# Patient Record
Sex: Female | Born: 1985 | Race: Black or African American | Hispanic: No | Marital: Single | Smoking: Former smoker
Health system: Southern US, Community
[De-identification: ages and names within clinical notes are randomized; demographics above are authoritative.]

## PROBLEM LIST (undated history)

## (undated) ENCOUNTER — Inpatient Hospital Stay (HOSPITAL_COMMUNITY): Payer: Self-pay

## (undated) ENCOUNTER — Emergency Department (HOSPITAL_COMMUNITY): Payer: BC Managed Care – PPO

## (undated) DIAGNOSIS — N751 Abscess of Bartholin's gland: Secondary | ICD-10-CM

## (undated) DIAGNOSIS — D573 Sickle-cell trait: Secondary | ICD-10-CM

## (undated) DIAGNOSIS — IMO0002 Reserved for concepts with insufficient information to code with codable children: Secondary | ICD-10-CM

## (undated) DIAGNOSIS — O99810 Abnormal glucose complicating pregnancy: Secondary | ICD-10-CM

## (undated) DIAGNOSIS — R87619 Unspecified abnormal cytological findings in specimens from cervix uteri: Secondary | ICD-10-CM

## (undated) DIAGNOSIS — J45909 Unspecified asthma, uncomplicated: Secondary | ICD-10-CM

## (undated) DIAGNOSIS — N750 Cyst of Bartholin's gland: Secondary | ICD-10-CM

## (undated) DIAGNOSIS — R8789 Other abnormal findings in specimens from female genital organs: Secondary | ICD-10-CM

## (undated) HISTORY — DX: Cyst of Bartholin's gland: N75.0

## (undated) HISTORY — PX: TOOTH EXTRACTION: SUR596

## (undated) HISTORY — DX: Unspecified asthma, uncomplicated: J45.909

## (undated) HISTORY — DX: Abscess of Bartholin's gland: N75.1

## (undated) HISTORY — DX: Sickle-cell trait: D57.3

## (undated) HISTORY — DX: Reserved for concepts with insufficient information to code with codable children: IMO0002

## (undated) HISTORY — DX: Abnormal glucose complicating pregnancy: O99.810

## (undated) HISTORY — DX: Other abnormal findings in specimens from female genital organs: R87.89

---

## 2006-02-22 ENCOUNTER — Emergency Department (HOSPITAL_COMMUNITY): Admission: EM | Admit: 2006-02-22 | Discharge: 2006-02-23 | Payer: Self-pay | Admitting: Emergency Medicine

## 2007-07-22 ENCOUNTER — Ambulatory Visit (HOSPITAL_COMMUNITY): Admission: RE | Admit: 2007-07-22 | Discharge: 2007-07-22 | Payer: Self-pay | Admitting: Family Medicine

## 2009-02-28 ENCOUNTER — Encounter: Payer: Self-pay | Admitting: Sports Medicine

## 2009-03-01 ENCOUNTER — Ambulatory Visit: Payer: Self-pay | Admitting: Physician Assistant

## 2009-03-01 ENCOUNTER — Inpatient Hospital Stay (HOSPITAL_COMMUNITY): Admission: AD | Admit: 2009-03-01 | Discharge: 2009-03-01 | Payer: Self-pay | Admitting: Obstetrics & Gynecology

## 2009-03-22 ENCOUNTER — Ambulatory Visit: Payer: Self-pay | Admitting: Family Medicine

## 2009-03-22 ENCOUNTER — Encounter: Payer: Self-pay | Admitting: Sports Medicine

## 2009-03-23 ENCOUNTER — Encounter: Payer: Self-pay | Admitting: Sports Medicine

## 2009-03-23 LAB — CONVERTED CEMR LAB
Antibody Screen: NEGATIVE
Basophils Absolute: 0 10*3/uL (ref 0.0–0.1)
Basophils Relative: 0 % (ref 0–1)
Eosinophils Absolute: 0.1 K/uL (ref 0.0–0.7)
Eosinophils Relative: 1 % (ref 0–5)
HCT: 32.9 % — ABNORMAL LOW (ref 36.0–46.0)
Hemoglobin: 11.5 g/dL — ABNORMAL LOW (ref 12.0–15.0)
Hepatitis B Surface Ag: NEGATIVE
Lymphocytes Relative: 31 % (ref 12–46)
Lymphs Abs: 2.1 K/uL (ref 0.7–4.0)
MCHC: 35 g/dL (ref 30.0–36.0)
MCV: 83.3 fL (ref 78.0–100.0)
Monocytes Absolute: 0.5 10*3/uL (ref 0.1–1.0)
Monocytes Relative: 7 % (ref 3–12)
Neutro Abs: 4.2 10*3/uL (ref 1.7–7.7)
Neutrophils Relative %: 61 % (ref 43–77)
Platelets: 264 10*3/uL (ref 150–400)
RBC: 3.95 M/uL (ref 3.87–5.11)
RDW: 12.7 % (ref 11.5–15.5)
Rh Type: POSITIVE
Rubella: 172.6 [IU]/mL — ABNORMAL HIGH
Sickle Cell Screen: POSITIVE — AB
WBC: 6.9 10*3/microliter (ref 4.0–10.5)

## 2009-03-27 ENCOUNTER — Telehealth: Payer: Self-pay | Admitting: Family Medicine

## 2009-03-27 ENCOUNTER — Ambulatory Visit: Payer: Self-pay | Admitting: Family Medicine

## 2009-03-27 ENCOUNTER — Encounter: Payer: Self-pay | Admitting: Sports Medicine

## 2009-03-31 DIAGNOSIS — D573 Sickle-cell trait: Secondary | ICD-10-CM

## 2009-03-31 LAB — CONVERTED CEMR LAB
Hgb A2 Quant: 3 %
Hgb A: 51.8 % — ABNORMAL LOW
Hgb F Quant: 4.1 % — ABNORMAL HIGH
Hgb S Quant: 41.1 % — ABNORMAL HIGH

## 2009-04-03 ENCOUNTER — Other Ambulatory Visit: Admission: RE | Admit: 2009-04-03 | Discharge: 2009-04-03 | Payer: Self-pay | Admitting: Family Medicine

## 2009-04-03 ENCOUNTER — Ambulatory Visit: Payer: Self-pay | Admitting: Family Medicine

## 2009-04-03 ENCOUNTER — Encounter: Payer: Self-pay | Admitting: *Deleted

## 2009-04-03 ENCOUNTER — Encounter: Payer: Self-pay | Admitting: Sports Medicine

## 2009-04-03 LAB — CONVERTED CEMR LAB
Chlamydia, DNA Probe: NEGATIVE
GC Probe Amp, Genital: NEGATIVE
Whiff Test: NEGATIVE

## 2009-04-04 ENCOUNTER — Ambulatory Visit (HOSPITAL_COMMUNITY): Admission: RE | Admit: 2009-04-04 | Discharge: 2009-04-04 | Payer: Self-pay | Admitting: Family Medicine

## 2009-04-04 ENCOUNTER — Encounter: Payer: Self-pay | Admitting: Sports Medicine

## 2009-04-06 ENCOUNTER — Ambulatory Visit: Payer: Self-pay | Admitting: Family Medicine

## 2009-04-06 ENCOUNTER — Encounter: Payer: Self-pay | Admitting: Sports Medicine

## 2009-04-06 LAB — CONVERTED CEMR LAB
Toxoplasma Antibody- IgM: <0.1
Toxoplasma IgG Ratio: <0.5

## 2009-05-03 ENCOUNTER — Ambulatory Visit: Payer: Self-pay | Admitting: Family Medicine

## 2009-06-01 ENCOUNTER — Encounter: Payer: Self-pay | Admitting: Sports Medicine

## 2009-06-01 ENCOUNTER — Ambulatory Visit: Payer: Self-pay | Admitting: Family Medicine

## 2009-06-02 ENCOUNTER — Encounter: Payer: Self-pay | Admitting: *Deleted

## 2009-06-05 ENCOUNTER — Ambulatory Visit (HOSPITAL_COMMUNITY): Admission: RE | Admit: 2009-06-05 | Discharge: 2009-06-05 | Payer: Self-pay | Admitting: Sports Medicine

## 2009-06-05 ENCOUNTER — Encounter: Payer: Self-pay | Admitting: Sports Medicine

## 2009-06-28 ENCOUNTER — Ambulatory Visit: Payer: Self-pay | Admitting: Family Medicine

## 2009-07-16 ENCOUNTER — Inpatient Hospital Stay (HOSPITAL_COMMUNITY): Admission: AD | Admit: 2009-07-16 | Discharge: 2009-07-16 | Payer: Self-pay | Admitting: Obstetrics & Gynecology

## 2009-07-25 ENCOUNTER — Ambulatory Visit: Payer: Self-pay | Admitting: Family Medicine

## 2009-07-25 ENCOUNTER — Encounter: Payer: Self-pay | Admitting: Sports Medicine

## 2009-07-31 ENCOUNTER — Encounter: Payer: Self-pay | Admitting: Sports Medicine

## 2009-07-31 ENCOUNTER — Ambulatory Visit: Payer: Self-pay | Admitting: Family Medicine

## 2009-07-31 DIAGNOSIS — O99810 Abnormal glucose complicating pregnancy: Secondary | ICD-10-CM

## 2009-07-31 HISTORY — DX: Abnormal glucose complicating pregnancy: O99.810

## 2009-08-09 ENCOUNTER — Encounter: Payer: Self-pay | Admitting: Sports Medicine

## 2009-08-09 ENCOUNTER — Ambulatory Visit: Payer: Self-pay | Admitting: Family Medicine

## 2009-08-09 LAB — CONVERTED CEMR LAB
HCT: 27.1 % — ABNORMAL LOW (ref 36.0–46.0)
Hemoglobin: 8.9 g/dL — ABNORMAL LOW (ref 12.0–15.0)
MCHC: 32.8 g/dL (ref 30.0–36.0)
MCV: 88.6 fL (ref 78.0–100.0)
Platelets: 216 10*3/uL (ref 150–400)
RBC: 3.06 M/uL — ABNORMAL LOW (ref 3.87–5.11)
RDW: 12.6 % (ref 11.5–15.5)
WBC: 7.3 10*3/uL (ref 4.0–10.5)

## 2009-08-22 ENCOUNTER — Telehealth (INDEPENDENT_AMBULATORY_CARE_PROVIDER_SITE_OTHER): Payer: Self-pay | Admitting: *Deleted

## 2009-08-30 ENCOUNTER — Ambulatory Visit: Payer: Self-pay | Admitting: Family Medicine

## 2009-08-30 DIAGNOSIS — O99019 Anemia complicating pregnancy, unspecified trimester: Secondary | ICD-10-CM

## 2009-09-12 ENCOUNTER — Telehealth: Payer: Self-pay | Admitting: Family Medicine

## 2009-09-18 ENCOUNTER — Ambulatory Visit: Payer: Self-pay | Admitting: Family Medicine

## 2009-09-18 ENCOUNTER — Encounter: Payer: Self-pay | Admitting: Sports Medicine

## 2009-09-18 LAB — CONVERTED CEMR LAB: Hemoglobin: 8.9 g/dL

## 2009-09-24 ENCOUNTER — Ambulatory Visit: Payer: Self-pay | Admitting: Obstetrics and Gynecology

## 2009-09-24 ENCOUNTER — Inpatient Hospital Stay (HOSPITAL_COMMUNITY): Admission: AD | Admit: 2009-09-24 | Discharge: 2009-09-24 | Payer: Self-pay | Admitting: Obstetrics and Gynecology

## 2009-09-25 ENCOUNTER — Telehealth (INDEPENDENT_AMBULATORY_CARE_PROVIDER_SITE_OTHER): Payer: Self-pay | Admitting: *Deleted

## 2009-10-02 ENCOUNTER — Encounter: Payer: Self-pay | Admitting: Sports Medicine

## 2009-10-02 ENCOUNTER — Ambulatory Visit: Payer: Self-pay | Admitting: Family Medicine

## 2009-10-02 LAB — CONVERTED CEMR LAB
Chlamydia, DNA Probe: NEGATIVE
GC Probe Amp, Genital: NEGATIVE
Hemoglobin: 9.4 g/dL

## 2009-10-10 ENCOUNTER — Ambulatory Visit: Payer: Self-pay | Admitting: Family Medicine

## 2009-10-10 LAB — CONVERTED CEMR LAB: Hemoglobin: 9.6 g/dL

## 2009-10-18 ENCOUNTER — Encounter: Payer: Self-pay | Admitting: Sports Medicine

## 2009-10-18 ENCOUNTER — Ambulatory Visit: Payer: Self-pay | Admitting: Family Medicine

## 2009-10-18 LAB — CONVERTED CEMR LAB
Bilirubin Urine: NEGATIVE
Blood in Urine, dipstick: NEGATIVE
Glucose, Urine, Semiquant: NEGATIVE
Hemoglobin: 10.5 g/dL
Ketones, urine, test strip: NEGATIVE
Nitrite: NEGATIVE
Protein, U semiquant: NEGATIVE
Specific Gravity, Urine: 1.02
Urobilinogen, UA: 0.2
WBC Urine, dipstick: NEGATIVE
pH: 7

## 2009-10-25 ENCOUNTER — Ambulatory Visit: Payer: Self-pay | Admitting: Family Medicine

## 2009-10-25 ENCOUNTER — Telehealth: Payer: Self-pay | Admitting: Family Medicine

## 2009-10-25 LAB — CONVERTED CEMR LAB: Hemoglobin: 8.8 g/dL

## 2009-10-26 ENCOUNTER — Encounter: Payer: Self-pay | Admitting: Sports Medicine

## 2009-10-26 ENCOUNTER — Inpatient Hospital Stay (HOSPITAL_COMMUNITY): Admission: AD | Admit: 2009-10-26 | Discharge: 2009-10-26 | Payer: Self-pay | Admitting: Obstetrics & Gynecology

## 2009-10-26 ENCOUNTER — Ambulatory Visit: Payer: Self-pay | Admitting: Advanced Practice Midwife

## 2009-10-27 ENCOUNTER — Encounter: Payer: Self-pay | Admitting: Sports Medicine

## 2009-10-27 ENCOUNTER — Ambulatory Visit: Payer: Self-pay | Admitting: Obstetrics & Gynecology

## 2009-10-28 ENCOUNTER — Inpatient Hospital Stay (HOSPITAL_COMMUNITY): Admission: AD | Admit: 2009-10-28 | Discharge: 2009-10-28 | Payer: Self-pay | Admitting: Obstetrics & Gynecology

## 2009-10-29 ENCOUNTER — Ambulatory Visit: Payer: Self-pay | Admitting: Advanced Practice Midwife

## 2009-10-29 ENCOUNTER — Inpatient Hospital Stay (HOSPITAL_COMMUNITY): Admission: AD | Admit: 2009-10-29 | Discharge: 2009-10-29 | Payer: Self-pay | Admitting: Family Medicine

## 2009-10-30 ENCOUNTER — Encounter: Payer: Self-pay | Admitting: Sports Medicine

## 2009-10-30 ENCOUNTER — Encounter: Payer: Self-pay | Admitting: Family Medicine

## 2009-10-30 ENCOUNTER — Ambulatory Visit: Payer: Self-pay | Admitting: Family Medicine

## 2009-10-30 ENCOUNTER — Inpatient Hospital Stay (HOSPITAL_COMMUNITY): Admission: AD | Admit: 2009-10-30 | Discharge: 2009-11-02 | Payer: Self-pay | Admitting: Obstetrics & Gynecology

## 2009-10-30 LAB — CONVERTED CEMR LAB: Hemoglobin: 11.2 g/dL

## 2009-11-06 ENCOUNTER — Ambulatory Visit: Payer: Self-pay | Admitting: Family Medicine

## 2009-11-23 ENCOUNTER — Encounter: Payer: Self-pay | Admitting: Sports Medicine

## 2009-12-18 ENCOUNTER — Ambulatory Visit: Payer: Self-pay | Admitting: Family Medicine

## 2009-12-18 DIAGNOSIS — N912 Amenorrhea, unspecified: Secondary | ICD-10-CM

## 2009-12-18 LAB — CONVERTED CEMR LAB: Beta hcg, urine, semiquantitative: NEGATIVE

## 2010-05-27 ENCOUNTER — Emergency Department (HOSPITAL_COMMUNITY): Admission: EM | Admit: 2010-05-27 | Discharge: 2010-05-28 | Payer: Self-pay | Admitting: Emergency Medicine

## 2010-06-07 ENCOUNTER — Ambulatory Visit: Payer: Self-pay | Admitting: Family Medicine

## 2010-06-07 DIAGNOSIS — N751 Abscess of Bartholin's gland: Secondary | ICD-10-CM

## 2010-06-07 HISTORY — DX: Abscess of Bartholin's gland: N75.1

## 2010-06-07 LAB — CONVERTED CEMR LAB: Beta hcg, urine, semiquantitative: NEGATIVE

## 2011-01-22 NOTE — Assessment & Plan Note (Signed)
 Summary: OB F/U [facility], [place]   Vital Signs:  Patient profile:   25 year old female Weight:      107 pounds Temp:     98.7 degrees F oral Pulse rate:   82 / minute BP sitting:   112 / 75  (left arm)  Vitals Entered By: Letitia Reusing (May 03, 2009 1:40 PM) CC: OB Is Patient Diabetic? No   History of Present Illness: 25 year old G3P0020 at 15.1 weeks, here for routine OB visit.  See prenatal flowsheet for further details.  Habits & Providers     Tobacco Status: never     Cigarette Packs/Day: n/a  Social History:    Smoking Status:  never    Packs/Day:  n/a   Impression & Recommendations:  Problem # 1:  SUPERVISION OF NORMAL FIRST PREGNANCY (ICD-V22.0) 15.1 weeks by LMP No issues found. Will check anatomy ultrasound at next visit.   Hx exposure to cat litter in beginning of pregnancy, Toxoplasma titers negative. Difficulty with tabs and chewable prenatals, will switch to prenatal caplets today.   Educated on S&S prompting MAU visit. Mother with sickle cell trait, awaiting FOB results.  Can complete genetic counselling when his results are in. RTC 4 weeks.  Orders: Medicaid OB visit - [facility] (00786)  Complete Medication List: 1)  Vita-natal Caps (Prenatal multivit-min-fe-fa) .... One cap by mouth daily  Patient Instructions: 1)  Great to see you today, 2)  Everything seems to be going normally.  Come back to see me in 4 weeks and we can set up your anatomy ultrasound to determine the sex.  Clotilda please forward me the Sickle Cell results when they are available. 3)  If you have any bleeding, fluid leakage, contractions then go to womens hospital for evaluation. 4)  I sent some prenatal caplets to the pharmacy, hopefully they wil be easier to swallow. 5)  Come back in 4 weeks. 6)  -Dr. ONEIDA. Prescriptions: VITA-NATAL  CAPS (PRENATAL MULTIVIT-MIN-FE-FA) One cap by mouth daily  #90 x 3   Entered and Authorized by:   Debby Petties MD   Signed by:   Debby Petties MD on  05/03/2009   Method used:   Electronically to        [id number] (retail)       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 test: 02/28/2009   Flowsheet View  for Follow-up Visit    Estimated weeks of       gestation:     15 1/7    Weight:     107    Blood pressure:   112 / 75    Headache:     No    Nausea/vomiting:   No    Edema:     0    Vaginal bleeding:   no    Vaginal discharge:   no    Fundal height:      15    FHR:       1448    Fetal activity:     no    Labor symptoms:   no    Fetal position:     N/A    Taking prenatal vits?   Y    Smoking:     n/a    Next visit:     4 wk    Resident:     Curtis    Preceptor:     Rowand    Comment:     Doing wel, having trouble with chewable vitamins, will switch to caplets.  No other complaints.   Flowsheet View for Follow-up Visit    Estimated weeks of       gestation:     15 1/7    Weight:     107    Blood pressure:   112 / 75    Hx headache?     No    Nausea/vomiting?   No    Edema?     0    Bleeding?     no    Leakage/discharge?   no    Fetal activity:       no    Labor symptoms?   no    Fundal height:      15    FHR:       1448    Fetal  position:      N/A    Taking Vitamins?   Y    Smoking PPD:   n/a    Comment:     Doing wel, having trouble with chewable vitamins, will switch to caplets.  No other complaints.    Next visit:     4 wk    Resident:     Curtis    Preceptor:     Xavier

## 2011-01-22 NOTE — Assessment & Plan Note (Signed)
 Summary: ob visit/eo   Vital Signs:  Patient profile:   25 year old female Weight:      118.7 pounds Temp:     98.1 degrees F oral Pulse rate:   78 / minute BP sitting:   95 / 64  (left arm)  Vitals Entered By: Letitia Reusing (June 28, 2009 2:13 PM) CC: ob Is Patient Diabetic? No   Primary Care Provider:  Debby Petties MD  CC:  ob.  History of Present Illness: 24G1P0 at 23.1 weeks here for routine OB visit.  See flowsheet for further details.  Habits & Providers  Alcohol-Tobacco-Diet     Tobacco Status: never     Cigarette Packs/Day: n/a   Impression & Recommendations:  Problem # 1:  SUPERVISION OF NORMAL FIRST PREGNANCY (ICD-V22.0) Assessment Unchanged 23.1 weeks by LMP No issues found. Adequate weight gain. Normal Anatomy US  - Female baby Hx exposure to cat litter in beginning of pregnancy, Toxoplasma titers negative. Tolerating prenatal caplets. Educated on S&S prompting MAU visit. Mother with sickle cell trait, still awaiting FOB results however he did recently have his blood drawn for the test.  Can complete genetic counselling when his results are in. RTC 4 weeks.  Orders: Medicaid OB visit - [facility] ([id number])  Complete Medication List: 1)  Prefera Ob + Dha 22-6-1 & 200 Mg Misc (Prenat fepoly-fehempo-fa-dha) .... One tab by mouth daily 2)  Acetaminophen  650 Mg Cr-tabs (Acetaminophen ) .... One tab by mouth q8h as needed for pain.  Patient Instructions: 1)  Great to see you today, 2)  Everything seems to be going normally.   3)  Clotilda please forward me the Sickle Cell results when they are available. 4)  If you have any bleeding, fluid leakage  then go to womens hospital for evaluation. 5)  Cont to take your prenatal vitamins. 6)  Come back in 4 weeks. 7)  I will send the Acetaminophen  to the pharmacy for your back pain. 8)  -Dr. ONEIDA. Prescriptions: ACETAMINOPHEN  650 MG CR-TABS (ACETAMINOPHEN ) One tab by mouth q8h as needed for pain.  #40 x 3   Entered and  Authorized by:   Debby Petties MD   Signed by:   Debby Petties MD on 06/28/2009   Method used:   Electronically to        [id number] (retail)       [address] [place]  [address]       [place], [place]  [address]       Ph: [phone] or [phone]       Fax: [phone]   RxID:   [id number]    Flowsheet View for Follow-up Visit    Estimated weeks of       gestation:     23 1/7    Weight:     118.7    Blood pressure:   95 / 64    Hx headache?     No    Nausea/vomiting?   No    Edema?     0    Bleeding?     no    Leakage/discharge?   no    Fetal activity:       yes    Labor symptoms?   no    Fundal height:      22    FHR:       145    Fetal position:  transv    Taking Vitamins?   Y    Smoking PPD:   n/a    Comment:     Doing well, tolerating PNV, normal anatomy US , feels baby kick/move, some LBP, will give Acetaminophen .  No other issues.  RTC 4 weeks.    Next visit:     4 wk    Resident:     Curtis    Preceptor:     Hudnall   Flowsheet View for Follow-up Visit    Estimated weeks of       gestation:     23 1/7    Weight:     118.7    Blood pressure:   95 / 64    Headache:     No    Nausea/vomiting:   No    Edema:     0    Vaginal bleeding:   no    Vaginal discharge:   no    Fundal height:      22    FHR:       145    Fetal activity:     yes    Labor symptoms:   no    Fetal position:     transv    Taking prenatal vits?   Y    Smoking:     n/a    Next visit:     4 wk    Resident:     Curtis    Preceptor:     Hudnall    Comment:     Doing well, tolerating PNV, normal anatomy US , feels baby kick/move, some LBP, will give Acetaminophen .  No other issues.  RTC 4 weeks.  Appended Document: ob visit/eo Correction, pt is a G3P0020 not G1 as in HPI.

## 2011-01-22 NOTE — Assessment & Plan Note (Signed)
 Summary: [facility]   Vital Signs:  Patient profile:   25 year old female Weight:      134.3 pounds Temp:     97.9 degrees F Pulse rate:   69 / minute BP sitting:   114 / 73  (left arm)  Vitals Entered By: Avelina Sharps RN (October 10, 2009 8:31 AM) CC: OB follow up Is Patient Diabetic? No   Primary Care Provider:  Debby Petties MD  CC:  OB follow up.  History of Present Illness: 25yo G3P0020 at 38.0wk for routine prenatal visit. See flowsheet and assessment for complete details.  Habits & Providers  Alcohol-Tobacco-Diet     Tobacco Status: quit     Cigarette Packs/Day: n/a  Social History: Smoking Status:  quit  Review of Systems       See HPI  Physical Exam  Abdomen:  Soft, gravid, consistent with dates.   Impression & Recommendations:  Problem # 1:  SUPERVISION OF NORMAL FIRST PREGNANCY (ICD-V22.0) Assessment Unchanged  G3P0020 38.0 weeks by LMP Adequate weight gain, 27 lbs so far. Normal Anatomy US  - Female baby Hb:11.5 -> 8.9 -> 8.9 -> Plt:264 RPR: NR x2 Blood: A pos Ab: neg HepB: neg HIV: neg x2 Rubella: Immune GC/Chlam: neg/neg Sickle Cell trait Hx exposure to cat litter in beginning of pregnancy, Toxoplasma titers negative. Tolerating prenatal caplets. Educated on S&S prompting MAU visit. 1hGTT 150, 3h- normal GBS neg Desires to BF.  Desires micronor .  RTC 1 week.  @ 40 weeks, schedule biweekly NST, induce at 41 weeks.  Orders: Hemoglobin-[facility] (14981)  Orders: Medicaid OB visit - [facility] ([id number])  Problem # 2:  ANEMIA (ICD-285.9) Assessment: Improved Tolerating Fe Gluconate, check Hb today.  Her updated medication list for this problem includes:    Ferrous Gluconate 325 Mg Tabs (Ferrous gluconate) ..... 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  Baby has 25% chance of being a trait carrier, 0% chance of having disease.  Orders: Medicaid OB  visit - [facility] ([id number])  Complete Medication List: 1)  Prefera Ob + Dha 22-6-1 & 200 Mg Misc (Prenat fepoly-fehempo-fa-dha) .... One tab by mouth daily 2)  Acetaminophen  650 Mg Cr-tabs (Acetaminophen ) .... One tab by mouth q8h as needed for pain. 3)  Ferrous Gluconate 325 Mg Tabs (Ferrous gluconate) .... One tab by mouth bid  Patient Instructions: 1)  Please schedule a follow-up appointment in 1 week. 2)  Go to [facility] if you have vaginal bleeding, rupture of membranes, painful contractions that are regular and occur every 4-5 minutes, or if you have any trauma to your abdomen. 3)  Do daily kick counts. 4)  Continue to take the iron pill 2x a day. 5)  -Dr. IVAR [place] for Follow-up Visit    Estimated weeks of       gestation:     38 0/7    Weight:     134.3    Blood pressure:   114 / 73    Hx headache?     No    Nausea/vomiting?   No    Edema?     0    Bleeding?     no    Leakage/discharge?   no    Fetal activity:       yes    Labor symptoms?   few ctx    Fundal height:  38    FHR:       135    Fetal position:      vertex    Cx dilation:     0    Cx effacement:   0    Fetal station:     high    Taking Vitamins?   Y    Smoking PPD:   n/a    Comment:     Doing well, tolerating PNV, tol iron, no other issues.  RTC 1 week.    Next visit:     1 wk    Resident:     Curtis    Preceptor:     Chambliss/Breen   Laboratory Results   Blood Tests   Date/Time Received: October 10, 2009 9:00 AM  Date/Time Reported: October 10, 2009 9:14 AM     CBC   HGB:  9.6 g/dL   (Normal Range: 86.9-82.9 in Males, 12.0-15.0 in Females) Comments: capillary sample ...............test performed by......SABRABonnie A. Jordan, [place] (ASCP)

## 2011-01-22 NOTE — Assessment & Plan Note (Signed)
 Summary: post partum/eo   Vital Signs:  Patient profile:   25 year old female Weight:      115.5 pounds Temp:     97.8 degrees F oral Pulse rate:   69 / minute Pulse rhythm:   regular BP sitting:   114 / 77  (right arm) Cuff size:   regular  Vitals Entered By: Bascom Pica CMA (December 18, 2009 10:51 AM) CC: post partum check, C-V Risk Management   Primary Care Provider:  Debby Petties MD  CC:  post partum check and C-V Risk Management.  History of Present Illness: 6wk PP, had PLTCS for failure to progress, doing well, mood good, breast+bottle, adapting well to new sleep schedule with baby.  No complaints.  Missed some doses of contraceptive so wants pregnancy test.  Cardiovascular Risk History:      Negative major cardiovascular risk factors include female age less than 67 years old and non-tobacco-user status.    Current Medications (verified): 1)  Prefera Ob + Dha 22-6-1 & 200 Mg Misc (Prenat Fepoly-Fehempo-Fa-Dha) .... One Tab By Mouth Daily 2)  Acetaminophen  650 Mg Cr-Tabs (Acetaminophen ) .... One Tab By Mouth Q8h As Needed For Pain. 3)  Ferrous Gluconate 325 Mg Tabs (Ferrous Gluconate) .... One Tab By Mouth Bid  Allergies (verified): No Known Drug Allergies  Review of Systems       See HPI  Physical Exam  General:  Well-developed,well-nourished,in no acute distress; alert,appropriate and cooperative throughout examination Abdomen:  Bowel sounds positive,abdomen soft and non-tender without masses, organomegaly or hernias noted. C/S scar well healed, no signs of hernias.   Impression & Recommendations:  Problem # 1:  POSTPARTUM EXAMINATION, NORMAL (ICD-V24.2) Assessment New Doing well, no complaints, come back to see me as needed.  Orders: Postpartum visit- [facility] (40569)  Problem # 2:  AMENORRHEA (ICD-626.0) Assessment: New Upreg neg.  Orders: U Preg-[facility] (81025)  Complete Medication List: 1)  Prefera Ob + Dha 22-6-1 & 200 Mg Misc (Prenat  fepoly-fehempo-fa-dha) .... One tab by mouth daily 2)  Acetaminophen  650 Mg Cr-tabs (Acetaminophen ) .... One tab by mouth q8h as needed for pain. 3)  Ferrous Gluconate 325 Mg Tabs (Ferrous gluconate) .... One tab by mouth bid  Cardiovascular Risk Assessment/Plan:      The patient's hypertensive risk group is category A: No risk factors and no target organ damage.  Today's blood pressure is 114/77.     Laboratory Results   Urine Tests  Date/Time Received: December 18, 2009 11:11 AM  Date/Time Reported: December 18, 2009 11:21 AM     Urine HCG: negative Comments: ...........test performed by...........SABRAArland Morel, CMA

## 2011-01-22 NOTE — Assessment & Plan Note (Signed)
 Summary: OB VISIT/EO   Vital Signs:  Patient profile:   25 year old female Weight:      137 pounds BP sitting:   104 / 72  Vitals Entered By: Nathanel Saba RN (October 18, 2009 8:58 AM)  Primary Care Provider:  Debby Petties MD   History of Present Illness: 25yo G3P0020 at 39.0wk for routine prenatal visit.  Some CTX that started last night about 2am, irregular. See flowsheet and assessment for complete details.  Habits & Providers  Alcohol-Tobacco-Diet     Cigarette Packs/Day: n/a  Review of Systems       See HPI  Physical Exam  General:  Well-developed,well-nourished,in no acute distress; alert,appropriate and cooperative throughout examination Abdomen:  Soft, gravid, consistent with dates.   Impression & Recommendations:  Problem # 1:  SUPERVISION OF NORMAL FIRST PREGNANCY (ICD-V22.0) Assessment Unchanged G3P0020 39.0 weeks by LMP Adequate weight gain, 27 lbs so far. Normal Anatomy US  - Female baby Hb:11.5 -> 8.9 -> 8.9 ->10.5 Plt:264 RPR: NR x2 Blood: A pos Ab: neg HepB: neg HIV: neg x2 Rubella: Immune GC/Chlam: neg/neg Sickle Cell trait Hx exposure to cat litter in beginning of pregnancy, Toxoplasma titers negative. Tolerating prenatal caplets. Educated on S&S prompting MAU visit. 1hGTT 150, 3h- normal GBS neg Desires to BF.  Desires micronor . RTC 1 week. @ 40 weeks, schedule biweekly NST, induce at 41 weeks. Few irregular CTX this visit, no cervical change, no signs ROM, afebrile, UA neg, likely false labor.  Orders: Urinalysis-[facility] (00000) Medicaid OB visit - [facility] ([id number])  Problem # 2:  ANEMIA (ICD-285.9) Assessment: Improved Hb Improved today, 10.5.  Her updated medication list for this problem includes:    Ferrous Gluconate 325 Mg Tabs (Ferrous gluconate) ..... 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  Baby has 25% chance of being a trait carrier, 0% chance of having disease.  Complete Medication List: 1)  Prefera Ob + Dha 22-6-1 & 200 Mg Misc (Prenat fepoly-fehempo-fa-dha) .... One tab by mouth daily 2)  Acetaminophen  650 Mg Cr-tabs (Acetaminophen ) .... One tab by mouth q8h as needed for pain. 3)  Ferrous Gluconate 325 Mg Tabs (Ferrous gluconate) .... One tab by mouth bid  Patient Instructions: 1)  Please schedule a follow-up appointment in 1 week. 2)  Go to [facility] if you have vaginal bleeding, rupture of membranes, painful contractions that are regular and occur every 4-5 minutes, or if you have any trauma to your abdomen. 3)  Do daily kick counts. 4)  Continue to take the iron pill 2x a day. 5)  I will send you to our lab for urinalysis and hemoglobin. 6)  Come back to see me in one week. 7)  -Dr. 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 test: 02/28/2009   Flowsheet View for Follow-up Visit    Estimated weeks of       gestation:     39 1/7    Weight:     137    Blood pressure:   104 / 72    Urine Protein:     negative    Urine Glucose:   negative    Urine Nitrite:     negative    Headache:     No    Nausea/vomiting:   No    Edema:     0    Vaginal bleeding:   no    Vaginal discharge:   no    Fundal height:      39    FHR:       130    Fetal activity:      yes    Labor symptoms:   few ctx    Fetal position:     vertex    Cx Dilation:     0    Cx Effacement:   0    Cx Station:     high    Taking prenatal vits?   Y    Smoking:     n/a    Next visit:     1 wk    Resident:     Curtis    Preceptor:     McDiarmid    Comment:     Doing well, tolerating PNV, tol iron, no other issues. More CTX today, irregular. No fluid leakage, bleeding. UA neg.  RTC 1 week.   Laboratory Results   Urine Tests  Date/Time Received: October 18, 2009 9:43 AM  Date/Time Reported: October 18, 2009 10:11 AM   Routine Urinalysis   Color: yellow Appearance: Clear Glucose: negative   (Normal Range: Negative) Bilirubin: negative   (Normal Range: Negative) Ketone: negative   (Normal Range: Negative) Spec. Gravity: 1.020   (Normal Range: 1.003-1.035) Blood: negative   (Normal Range: Negative) pH: 7.0   (Normal Range: 5.0-8.0) Protein: negative   (Normal Range: Negative) Urobilinogen: 0.2   (Normal Range: 0-1) Nitrite: negative   (Normal Range: Negative) Leukocyte Esterace: negative   (Normal Range: Negative)    Comments: ...........test performed by...........SABRAArland Morel, CMA   Blood Tests   Date/Time Received: October 18, 2009 9:43 AM  Date/Time Reported: October 18, 2009 10:11 AM     CBC   HGB:  10.5 g/dL   (Normal Range: 86.9-82.9 in Males, 12.0-15.0 in Females) Comments: ...........test performed by...........SABRAArland Morel, CMA      Flowsheet View for Follow-up Visit    Estimated weeks of       gestation:     39 1/7    Weight:     137    Blood pressure:   104 / 72    Urine protein:       negative    Urine glucose:    negative    Urine nitrite:     negative    Hx headache?     No    Nausea/vomiting?   No    Edema?     0    Bleeding?     no    Leakage/discharge?   no    Fetal activity:  yes    Labor symptoms?   few ctx    Fundal height:      39    FHR:       130    Fetal position:      vertex    Cx dilation:      0    Cx effacement:   0    Fetal station:     high    Taking Vitamins?   Y    Smoking PPD:   n/a    Comment:     Doing well, tolerating PNV, tol iron, no other issues. More CTX today, irregular. No fluid leakage, bleeding. UA neg.  RTC 1 week.    Next visit:     1 wk    Resident:     Curtis    Preceptor:     McDiarmid

## 2011-01-22 NOTE — Assessment & Plan Note (Signed)
 Summary: ob   Primary Care Provider:  Debby Petties MD   History of Present Illness: 25 year old G3P0020 at19.2 weeks, here for routine OB visit.  See prenatal flowsheet for further details.   Flowsheet View for Follow-up Visit    Estimated weeks of       gestation:     19 2/7    Weight:     111    Blood pressure:   114 / 68    Hx headache?     No    Nausea/vomiting?   No    Edema?     0    Bleeding?     no    Leakage/discharge?   no    Fetal activity:       yes    Labor symptoms?   no    Fundal height:      19    FHR:       140    Fetal position:      N/A    Taking Vitamins?   Y    Smoking PPD:   n/a    Comment:     Doing well, tolerating PNV, anatomy US  today.    Next visit:     4 wk    Resident:     Petties    Preceptor:     Chambliss  Physical Examination  Vital Signs:  BP (upright): 114/68  Wt: 111    Impression & Recommendations:  Problem # 1:  SUPERVISION OF NORMAL FIRST PREGNANCY (ICD-V22.0) Assessment Unchanged 19.2 weeks by LMP No issues found. Will check anatomy ultrasound now.   Hx exposure to cat litter in beginning of pregnancy, Toxoplasma titers negative. Tolerating prenatal caplets. Educated on S&S prompting MAU visit. Mother with sickle cell trait, awaiting FOB results.  Can complete genetic counselling when his results are in. RTC 4 weeks.  Orders: Medicaid OB visit - [facility] (00786) Prenatal U/S > 14 weeks - 23194 (Prenatal U/S)  Complete Medication List: 1)  Prefera Ob + Dha 22-6-1 & 200 Mg Misc (Prenat fepoly-fehempo-fa-dha) .... One tab by mouth daily  Patient Instructions: 1)  Great to see you today, 2)  Everything seems to be going normally.   3)  We will now set up your anatomy ultrasound to determine the sexof the baby.   4)  Clotilda please forward me the Sickle Cell results when they are available. 5)  If you have any bleeding, fluid leakage, contractions then go to womens hospital for evaluation. 6)  Cont to take your  prenatal vitamins. 7)  Come back in 4 weeks. 8)  -Dr. IVAR [place] for Follow-up Visit    Estimated weeks of       gestation:     19 2/7    Weight:     111    Blood pressure:   114 / 68    Headache:     No    Nausea/vomiting:   No    Edema:     0    Vaginal bleeding:   no    Vaginal discharge:   no    Fundal height:      19    FHR:       140    Fetal activity:     yes    Labor symptoms:   no    Fetal position:     N/A    Taking prenatal vits?   Y    Smoking:  n/a    Next visit:     4 wk    Resident:     Curtis    Preceptor:     Chambliss    Comment:     Doing well, tolerating PNV, anatomy US  today.   Flowsheet View for Follow-up Visit    Estimated weeks of       gestation:     19 2/7    Weight:     111    Blood pressure:   114 / 68    Hx headache?     No    Nausea/vomiting?   No    Edema?     0    Bleeding?     no    Leakage/discharge?   no    Fetal activity:       yes    Labor symptoms?   no    Fundal height:      19    FHR:       140    Fetal position:      N/A    Taking Vitamins?   Y    Smoking PPD:   n/a    Comment:     Doing well, tolerating PNV, anatomy US  today.    Next visit:     4 wk    Resident:     Curtis    Preceptor:     Jeanelle

## 2011-01-22 NOTE — Miscellaneous (Signed)
 Summary: Hb @ [facility]  Clinical Lists Changes  Observations: Added new observation of HGB: 11.2 g/dL (88/91/7989 3:73)

## 2011-01-22 NOTE — Assessment & Plan Note (Signed)
Summary: discuss birth control,tcb   Vital Signs:  Patient profile:   25 year old female Weight:      121.7 pounds Temp:     97.9 degrees F oral Pulse rate:   66 / minute Pulse rhythm:   regular BP sitting:   104 / 72  Vitals Entered By: Loralee Pacas CMA (June 07, 2010 8:54 AM)   Primary Care Provider:  Rodney Langton MD   History of Present Illness: Went to ED 6/6, I&D right bartholin cyst abscess the size of a golf ball, Got abx, no cx, no purulence, iodoform placed.  Healing well, here for fu. No fevers, chills, dysuria, diarrhea, constipation.  Also wants to discuss birth control.  Wants Depo, Upreg neg.  Current Medications (verified): 1)  Prefera Ob + Dha 22-6-1 & 200 Mg Misc (Prenat Fepoly-Fehempo-Fa-Dha) .... One Tab By Mouth Daily 2)  Acetaminophen 650 Mg Cr-Tabs (Acetaminophen) .... One Tab By Mouth Q8h As Needed For Pain. 3)  Ferrous Gluconate 325 Mg Tabs (Ferrous Gluconate) .... One Tab By Mouth Bid  Allergies (verified): No Known Drug Allergies  Past History:  Past Medical History: Hx elective abortion x2 Bartholins cyst abscess inside R labia minora (I&D 05/28/10)  Review of Systems       See HPI  Physical Exam  General:  Well-developed,well-nourished,in no acute distress; alert,appropriate and cooperative throughout examination Lungs:  Normal respiratory effort, chest expands symmetrically. Lungs are clear to auscultation, no crackles or wheezes. Heart:  Normal rate and regular rhythm. S1 and S2 normal without gallop, murmur, click, rub or other extra sounds. Abdomen:  Bowel sounds positive,abdomen soft and non-tender without masses, organomegaly or hernias noted. Genitalia:  Normal introitus for age, no external lesions, no vaginal discharge, mucosa pink and moist, no vaginal or cervical lesions, no vaginal atrophy, no friaility or hemorrhage, normal uterus size and position, no adnexal masses or tenderness.  Well healing I&D incision site, no  drainage, non-tender.   Impression & Recommendations:  Problem # 1:  BARTHOLIN'S CYST ABSCESS (ICD-616.3) Assessment New S/p I&D, healing well.  Marsupialization vs word catheter if recurs.  Orders: [facility]- Est  Level 4 (75643)  Problem # 2:  CONTRACEPTIVE MANAGEMENT (ICD-V25.09) Assessment: Unchanged Upreg neg, Depo given, discussed all contraceptive options including COCs, Patch, depo, nuvaring, implanon, IUD, BTL.  Pt desires cont with depo.  Orders: U Preg-[facility] (81025) [facility]- Est  Level 4 (99214)  Complete Medication List: 1)  Prefera Ob + Dha 22-6-1 & 200 Mg Misc (Prenat fepoly-fehempo-fa-dha) .... One tab by mouth daily 2)  Acetaminophen 650 Mg Cr-tabs (Acetaminophen) .... One tab by mouth q8h as needed for pain. 3)  Ferrous Gluconate 325 Mg Tabs (Ferrous gluconate) .... One tab by mouth bid  Laboratory Results   Urine Tests  Date/Time Received: June 07, 2010 8:59 AM  Date/Time Reported: June 07, 2010 9:07 AM     Urine HCG: negative Comments: ...............test performed by......Marland KitchenBonnie A. [place], MLS (ASCP)cm     Appended Document: discuss birth control,tcb     Allergies: No Known Drug Allergies   Complete Medication List: 1)  Prefera Ob + Dha 22-6-1 & 200 Mg Misc (Prenat fepoly-fehempo-fa-dha) .... One tab by mouth daily 2)  Acetaminophen 650 Mg Cr-tabs (Acetaminophen) .... One tab by mouth q8h as needed for pain. 3)  Ferrous Gluconate 325 Mg Tabs (Ferrous gluconate) .... One tab by mouth bid  Other Orders: Depo-Provera 150mg  (P2951)    Medication Administration  Injection # 1:    Medication: Depo-Provera  150mg     Diagnosis: CONTRACEPTIVE MANAGEMENT (ICD-V25.09)    Route: IM    Site: LUOQ gluteus    Exp Date: 01/23/2013    Lot #: [id number]    Mfr: greenstone    Comments: pt to rtc sept 1-15,2011    Patient tolerated injection without complications    Given by: Loralee Pacas CMA (June 08, 2010 4:41 PM)  Orders Added: 1)  Depo-Provera 150mg   [J1055]

## 2011-01-22 NOTE — Miscellaneous (Signed)
 Summary: labor?  Clinical Lists Changes states she is 40 wk & 2 days. having contractions that are very painful. has not timed them. no loss of mucous or fluids. advised timing them & [facility] 5-7 minutes [facility]. this is her first baby. husband is there with her. told her to walk around as tolerated. go if water breaks.she agreed with plan.Raejean Mau RN  October 26, 2009 10:16 AM  Thanks, perfect.  I have given her labor precautions previously too so she should know what to look out for. -Dr. ONEIDA.

## 2011-01-22 NOTE — Miscellaneous (Signed)
 Summary: allergies  Clinical Lists Changes Pt id here for labs.c/o stuffy nose, sore throat. advised trial of benadryl . next appt in one month. will try the med & call if that does not helped. explained that some women have stuffy noses during pregnancy.Raejean Mau RN  April 06, 2009 9:51 AM

## 2011-01-22 NOTE — Miscellaneous (Signed)
 Summary: FMLA  Patiend dropped off FMLA form to be filled out.  She says that it needs to be sent today.  Please fax when completed. Nathanel No  October 18, 2009 11:06 AM  Gave to Dr. ONEIDA ............................................... [facility] October 18, 2009 11:13 AM   Form completed and put in admin to-do box. -Dr. ONEIDA.

## 2011-01-22 NOTE — Assessment & Plan Note (Signed)
 Summary: OB/KH   Vital Signs:  Patient profile:   25 year old female Weight:      132.56 pounds BP sitting:   110 / 79  (left arm)  Vitals Entered By: Arland Morel (October 02, 2009 11:41 AM) CC: OB check Is Patient Diabetic? No Pain Assessment Patient in pain? no        Primary Care Provider:  Debby Petties MD  CC:  OB check.  History of Present Illness: 25yo G3P0020 at 36.6wk for routine prenatal visit. See flowsheet and assessment for complete details.  Habits & Providers  Alcohol-Tobacco-Diet     Tobacco Status: quit > 6 months     Cigarette Packs/Day: n/a  Social History: Smoking Status:  quit > 6 months  Review of Systems       See HPI  Physical Exam  General:  Well-developed,well-nourished,in no acute distress; alert,appropriate and cooperative throughout examination Abdomen:  Soft, gravid, consistent with dates.   Impression & Recommendations:  Problem # 1:  SUPERVISION OF NORMAL FIRST PREGNANCY (ICD-V22.0) Assessment Unchanged G3P0020 36 6/7 weeks by LMP Adequate weight gain, 20 lbs so far. Normal Anatomy US  - Female baby Hb:11.5 -> 8.9 -> 8.9 Plt:264 RPR: NR x2 Blood: A pos Ab: neg HepB: neg HIV: neg x2 Rubella: Immune GC/Chlam: neg/neg Sickle Cell trait Hx exposure to cat litter in beginning of pregnancy, Toxoplasma titers negative. Tolerating prenatal caplets. Educated on S&S prompting MAU visit. 1hGTT 150, 3h- normal Desires to BF.  Desires micronor .  GBS/GC/Chlam done today.  RTC 1 week.  Orders: GC/Chlamydia-[facility] (87591/87491) Grp B Probe-[facility] (12850-29339) Hemoglobin-[facility] (85018) Medicaid OB visit - [facility] (00786)  Problem # 2:  ANEMIA (ICD-285.9) Assessment: Unchanged Tolerating Fe Gluconate, check Hb today.  Her updated medication list for this problem includes:    Ferrous Gluconate 325 Mg Tabs (Ferrous gluconate) ..... One tab by mouth bid  Problem # 3:  SICKLE-CELL TRAIT (ICD-282.5)  FOB had negative sickle test.   Baby has 25% chance of being a trait carrier, 0% chance of having disease.  Orders: Medicaid OB visit - [facility] (00786)  Complete Medication List: 1)  Prefera Ob + Dha 22-6-1 & 200 Mg Misc (Prenat fepoly-fehempo-fa-dha) .... One tab by mouth daily 2)  Acetaminophen  650 Mg Cr-tabs (Acetaminophen ) .... One tab by mouth q8h as needed for pain. 3)  Ferrous Gluconate 325 Mg Tabs (Ferrous gluconate) .... One tab by mouth bid  Patient Instructions: 1)  Please schedule a follow-up appointment in 1 week. 2)  Go to [facility] if you have vaginal bleeding, rupture of membranes, painful contractions that are regular and occur every 4-5 minutes, or if you have any trauma to your abdomen. 3)  Do daily kick counts. 4)  Continue to take the iron pill 2x a day. 5)  -Dr. IVAR [place] for Follow-up Visit    Estimated weeks of       gestation:     36 6/7    Weight:     132.56    Blood pressure:   110 / 79    Hx headache?     No    Nausea/vomiting?   No    Edema?     0    Bleeding?     no    Leakage/discharge?   no    Fetal activity:       yes    Labor symptoms?   no    Fundal height:      37  FHR:       135    Fetal position:      vertex    Cx dilation:     0    Cx effacement:   0    Fetal station:     high    Taking Vitamins?   Y    Smoking PPD:   n/a    Comment:     Doing well, tolerating PNV, tol iron, no other issues.  RTC 1 week.    Next visit:     1 wk    Resident:     Curtis    Preceptor:     Con   Laboratory Results   Blood Tests   Date/Time Received: October 02, 2009 12:05 PM  Date/Time Reported: October 02, 2009 12:13 PM     CBC   HGB:  9.4 g/dL   (Normal Range: 86.9-82.9 in Males, 12.0-15.0 in Females) Comments: capillary sample ...............test performed by......SABRABonnie A. Jordan, MT (ASCP)

## 2011-01-22 NOTE — Letter (Signed)
 Summary: Generic Letter  [facility]  [address]   [place], [place] [address]   Phone: [phone]  Fax: [phone]    09/18/2009  Morgan Todd [address] [place], [place]  [address]  To whom it may concern,  [name] is a patient of mine and needs to be assisted in mobility from today, 09/18/2009 until Nov 21, 2009.  She cannot walk to the bus stop during this time.  Feel free to contact me with any questions concerning this issue.     Sincerely,     Debby Petties MD

## 2011-01-22 NOTE — Assessment & Plan Note (Signed)
 Summary: NOB/DSL(resch'd from 4/2/ per k foster)bmc   Vital Signs:  Patient profile:   25 year old female LMP:     01/17/2009  History of Present Illness: New OB visit.  See flowsheet for details.  Past History:  Past Medical History:    Hx elective abortion x2  Social History:    Occupation:  retail    Education:  College    Hepatitis Risk:  no  Review of Systems       12 point negative except as in HPI and flowsheet.  Physical Exam  General:  Well-developed,well-nourished,in no acute distress; alert,appropriate and cooperative throughout examination Neck:  No deformities, masses, or tenderness noted. Lungs:  Normal respiratory effort, chest expands symmetrically. Lungs are clear to auscultation, no crackles or wheezes. Heart:  Normal rate and regular rhythm. S1 and S2 normal without gallop, murmur, click, rub or other extra sounds. Abdomen:  Gravid, soft, NT/ND, +BS Genitalia:  Normal introitus for age, no external lesions, no vaginal discharge, mucosa pink and moist, no vaginal or cervical lesions, no vaginal atrophy, no friaility or hemorrhage, normal gravid uterus size and position, no adnexal masses or tenderness Extremities:  No clubbing, cyanosis, edema, or deformity noted with normal full range of motion of all joints.   Additional Exam:  FHR 140 by doppler.   Impression & Recommendations:  Problem # 1:  SUPERVISION OF NORMAL FIRST PREGNANCY (ICD-V22.0) 22 G3P0020, new OB visit, healthy pregnancy so far.  Issues are unsure LMP, Sickle cell trait, and exposure to cat litter.  Next visit in 4 weeks.  Declines Genetic screening.  PNV chewables prescribed today.  -Unsure LMP:  10.6 weeks by LMP but will get dating ultrasound to be sure.  -Sickle cell trait:  FOB [place]) will be getting sickle cell testing tomorrow and agrees to forward me the results, he does not have the disease.  Genetic counselling done, if FOB negative, then 0% chance of baby having sickle cell,  if FOB has trait then 25% chance of baby having SS disease.  -Exposure to Cat litter:  Worrisome for toxoplasmosis exposure.  Pt has not had mono-like symptoms since LMP but most toxo infections are asymptomatic.  D/w Dr. Starla, will check TORCH titers.  If IgM then recent infection and will likely need treatment with spiromycin, if negative or IgG positive then no intervention needed.  FOB will change cat litter at home from now on.  Orders: GC/Chlamydia-[facility] (87591/87491) Wet Prep- [facility] (12789) Medicaid OB visit - [facility] (00786) Prenatal U/S < 14 weeks - 23198  (Prenatal U/S)Future Orders: Miscellaneous Lab Charge-[facility] (00000) ... 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 One tab by mouth daily  Other Orders: Pap Smear-[facility] (11824-02999)  Patient Instructions: 1)  Great to meet you two today! 2)  I have sent your prenatal vitamins to your pharmacy, take them daily. 3)  Also I want you to go for your ultrasound for dating purposes. 4)  Come back to see me in 4 weeks. 5)  If you have any bleeding, fluid leakage, contractions then [facility] to [facility]. 6)  Be sure to have Clotilda forward me the copy of his sickle cell test. 7)  -Dr. ONEIDA. Prescriptions: PRENATAL 19  CHEW (PRENATAL VIT-FE FUMARATE-FA) One tab by mouth daily  #30 x 10   Entered and Authorized by:   Debby Petties MD   Signed by:  Debby Petties MD on 04/03/2009   Method used:   Electronically to        [id number] (retail)       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 : Yes    Menarche: 11 years    Menses interval: 30 days    Menstrual flow 5 days    On BCP's at conception: no    Date of positive (+) home preg. test: 02/28/2009    Pre Pregnancy Weight: 100 lbs.    Symptoms since LMP: amenorrhea, nausea, vomiting, fatigue, irritability, tender breasts, urinary frequency  Prenatal Visit    FOB name: Clotilda [facility] Confirmation:    New working [facility]: 10/24/2009    LMP reliable? Yes    Last menses onset (LMP) date: 01/17/2009    EDC by LMP: 10/24/2009   Past Pregnancy History    Gravida:     3    Term Births:     0    Premature Births:   0    Living Children:   0    Para:       0    Mult. Births:     0    Prev C-Section:   0    Aborta:     2    Elect. Ab:     2    Spont. Ab:     0    Ectopics:     0  Pregnancy # 1    Comments:     May 2005 elective abortion, 9 wk  Pregnancy # 2    Comments:     May 2008, 9 weeks, elective abortion   Genetic History     Thalassemia:     mother: no    Neural tube defect:   mother: no    Down's Syndrome:   mother: no    Tay-Sachs:     mother: no    Sickle Cell Dz/Trait:   mother: yes       comments: Pt has trait    Hemophilia:     mother: no    Muscular Dystrophy:   mother: no    Cystic Fibrosis:   mother: no    Huntington's Dz:   mother: no    Mental Retardation:   mother: no  Fragile X:     mother: no    Other Genetic or       Chromosomal Dz:   mother: no    Child with other       birth defect:     mother: no     > 3 spont. abortions:   mother: no    Hx of stillbirth:     mother: no  Infection Risk History    High Risk Hepatitis B: no    Immunized against Hepatitis B: yes    Exposure to TB: no    Patient with history of Genital Herpes: no    Sexual partner with history of Genital Herpes: no    History of STD (GC, Chlamydia, Syphilis, HPV): yes    Specific STD: Chlamydia    Rash, Viral, or Febrile Illness since LMP: no    Exposure to Cat Litter: yes    Chicken Pox Immune Status: Hx of Disease: Immune    History of Parvovirus (Fifth Disease): no    Occupational Exposure to Children: none  Environmental Exposures    Xray Exposure since LMP: no    Chemical or other exposure: no    Medication, drug, or alcohol use since LMP: no   Flowsheet View for Follow-up Visit    Estimated weeks of       gestation:     10 6/7   Laboratory Results  Date/Time Received: April 03, 2009 10:38 AM  Date/Time Reported: April 03, 2009 10:46 AM   Wet [place]: vag WBC/hpf: 5-10 Bacteria/hpf: 3+  Rods Clue cells/hpf: none  Negative whiff Yeast/hpf: none Trichomonas/hpf: none Comments: ...............test performed by......SABRABonnie A. Jordan, MT (ASCP)    Appended Document: NOB/DSL(resch'd from 4/2/ per k foster)bmc Error, FHR not assessed at this visit.  -Dr. ONEIDA.

## 2011-01-22 NOTE — Assessment & Plan Note (Signed)
 Summary: ob,df   Vital Signs:  Patient profile:   25 year old female Weight:      127 pounds Temp:     97.4 degrees F oral Pulse rate:   84 / minute BP sitting:   104 / 69  (left arm)  Vitals Entered By: Letitia Reusing (September 18, 2009 1:41 PM) CC: OB   Primary Care Provider:  Debby Petties MD  CC:  OB.  History of Present Illness: 25yo G3P0020 at 34.6wk for routine prenatal visit. See flowsheet and assessment for complete details.  Habits & Providers  Alcohol-Tobacco-Diet     Tobacco Status: never     Cigarette Packs/Day: n/a  Physical Exam  General:  Well-developed,well-nourished,in no acute distress; alert,appropriate and cooperative throughout examination Abdomen:  Soft, gravid, consistent with dates.   Impression & Recommendations:  Problem # 1:  SUPERVISION OF NORMAL FIRST PREGNANCY (ICD-V22.0) Assessment Unchanged G3P0020 32 1/7 weeks by LMP Adequate weight gain, 20 lbs so far. Normal Anatomy US  - Female baby Hb:11.5 -> 8.9 -> 8.9 Plt:264 RPR: NR x2 Blood: A pos Ab: neg HepB: neg HIV: neg x2 Rubella: Immune GC/Chlam: neg/neg Sickle Cell trait Hx exposure to cat litter in beginning of pregnancy, Toxoplasma titers negative. Tolerating prenatal caplets. Educated on S&S prompting MAU visit. 1hGTT 150, 3h- normal  Discussed recent lab work. Desires to BF.  Desires micronor . Will f/u in 2 weeks.  GBS/GC/Chlam at next visit.  Orders: Medicaid OB visit - [facility] (00786)  Problem # 2:  ANEMIA (ICD-285.9) Assessment: Unchanged Not tolerating ferrous sulfate, changing to ferrous gluconate.    Her updated medication list for this problem includes:    Ferrous Gluconate 325 Mg Tabs (Ferrous gluconate) ..... 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  Baby has 25% chance of being a trait carrier, 0% chance  of having disease.  Orders: Medicaid OB visit - [facility] (00786)  Complete Medication List: 1)  Prefera Ob + Dha 22-6-1 & 200 Mg Misc (Prenat fepoly-fehempo-fa-dha) .... One tab by mouth daily 2)  Acetaminophen  650 Mg Cr-tabs (Acetaminophen ) .... One tab by mouth q8h as needed for pain. 3)  Ferrous Gluconate 325 Mg Tabs (Ferrous gluconate) .... One tab by mouth bid  Patient Instructions: 1)  Please schedule a follow-up appointment in 2 weeks. 2)  Go to [facility] if you have vaginal bleeding, rupture of membranes, painful contractions that are regular and occur every 4-5 minutes, or if you have any trauma to your abdomen. 3)  Do daily kick counts. 4)  Continue to take the new iron pill 2x a day. 5)  -Dr. ONEIDA. 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 test: 02/28/2009   Flowsheet View for Follow-up Visit    Estimated weeks of       gestation:     34 6/7    Weight:     127    Blood pressure:   104 / 69    Headache:     No    Nausea/vomiting:   No    Edema:     0    Vaginal bleeding:   no    Vaginal discharge:   no     Fundal height:      34    FHR:       135    Fetal activity:     yes    Labor symptoms:   no    Fetal position:     vertex    Taking prenatal vits?   Y    Smoking:     n/a    Next visit:     2 wk    Resident:     Curtis    Preceptor:     Sharlet    Comment:     Doing well, tolerating PNV, not tolerating iron, no other issues.  RTC 2 weeks.    Flowsheet View for Follow-up Visit    Estimated weeks of       gestation:     34 6/7    Weight:     127    Blood pressure:   104 / 69    Hx headache?     No    Nausea/vomiting?   No    Edema?     0    Bleeding?     no    Leakage/discharge?   no    Fetal activity:       yes    Labor symptoms?   no    Fundal height:      34    FHR:       135    Fetal position:      vertex    Taking Vitamins?   Y    Smoking PPD:   n/a    Comment:     Doing well, tolerating PNV, not tolerating iron, no other issues.  RTC 2 weeks.    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 test: 02/28/2009    Laboratory Results   Blood Tests   Date/Time Received: September 18, 2009 2:08 PM  Date/Time Reported: September 18, 2009 2:19 PM     CBC   HGB:  8.9 g/dL   (Normal Range: 86.9-82.9 in Males, 12.0-15.0 in Females) Comments: ...........test performed by...........SABRAArland Morel,  CMA

## 2011-01-22 NOTE — Assessment & Plan Note (Signed)
 Summary: OB VISIT/EO   Vital Signs:  Patient profile:   25 year old female Weight:      125.9 pounds BP sitting:   106 / 67  Vitals Entered By: Chrissie Lerner CMA, (August 09, 2009 10:24 AM)  Primary Care Provider:  Debby Petties MD   History of Present Illness: 24 G3P0020 29.1 wks, routine OB visit, see flowsheet for further details.  Habits & Providers  Alcohol-Tobacco-Diet     Cigarette Packs/Day: n/a  Physical Exam  Abdomen:  Gravid, size appropriate for dates.   Impression & Recommendations:  Problem # 1:  SUPERVISION OF NORMAL FIRST PREGNANCY (ICD-V22.0) Assessment Unchanged G3P0020 29.1 weeks by LMP No issues found. Adequate weight gain. Normal Anatomy US  - Female baby Hb:11.5 Plt:264 RPR: NR Blood: A pos Ab: neg HepB: neg HIV: neg Rubella: Immune GC/Chlam: neg/neg Sickle Cell trait Hx exposure to cat litter in beginning of pregnancy, Toxoplasma titers negative. Tolerating prenatal caplets. Educated on S&S prompting MAU visit. 1hGTT 150, 3h- normal  CBC, RPR, HIV today  RTC 2 weeks.  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  Genetic counselling completed.  Baby has 25% chance of being a trait carrier, 0% chance of having disease.  Orders: Medicaid OB visit - [facility] ([id number])  Complete Medication List: 1)  Prefera Ob + Dha 22-6-1 & 200 Mg Misc (Prenat fepoly-fehempo-fa-dha) .... One tab by mouth daily 2)  Acetaminophen  650 Mg Cr-tabs (Acetaminophen ) .... One tab by mouth q8h as needed for pain.  Patient Instructions: 1)  Great to see you today, 2)  Everything is going normally.   3)  If you have any bleeding, fluid leakage  then [facility]. 4)  Cont to take your prenatal vitamins. 5)  Come back in 2 weeks. 6)  Come to the lab to have your HIV, RPR, CBC  checked. 7)  -Dr. 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 test: 02/28/2009   Flowsheet View for Follow-up Visit    Estimated weeks of       gestation:     29 1/7    Weight:     125.9    Blood pressure:   106 / 67    Headache:     No    Nausea/vomiting:   No    Edema:     0    Vaginal bleeding:   no    Vaginal discharge:   no    Fundal height:      30    FHR:       135  Fetal activity:     yes    Labor symptoms:   no    Fetal position:     vertex    Taking prenatal vits?   Y    Smoking:     n/a    Next visit:     2 wk    Resident:     Curtis    Preceptor:     Chambliss    Comment:     Doing well, tolerating PNV, feels baby kick/move,  No other issues.  HIV, RPR, CBC today,  RTC 2 weeks.    Flowsheet View for Follow-up Visit    Estimated weeks of       gestation:     29 1/7    Weight:     125.9    Blood pressure:   106 / 67    Hx headache?     No    Nausea/vomiting?   No    Edema?     0    Bleeding?     no    Leakage/discharge?   no    Fetal activity:       yes    Labor symptoms?   no    Fundal height:      30    FHR:       135    Fetal position:      vertex    Taking Vitamins?   Y    Smoking PPD:   n/a    Comment:     Doing well, tolerating PNV, feels baby kick/move,  No other issues.  HIV, RPR, CBC today,  RTC 2 weeks.    Next visit:     2 wk    Resident:      Curtis    Preceptor:     Jeanelle

## 2011-01-22 NOTE — Assessment & Plan Note (Signed)
 Summary: OB/KH   Vital Signs:  Patient profile:   25 year old female Weight:      139 pounds BP sitting:   115 / 76  (right arm)  Vitals Entered By: Arland Morel (October 25, 2009 9:03 AM) Is Patient Diabetic? No Pain Assessment Patient in pain? no        Primary Care Provider:  Debby Petties MD   History of Present Illness: 25yo G3P0020 at 40.1 wk for routine prenatal visit.  See flowsheet and assessment for complete details.  Habits & Providers  Alcohol-Tobacco-Diet     Tobacco Status: never     Cigarette Packs/Day: n/a  Social History: Smoking Status:  never  Physical Exam  General:  Well-developed,well-nourished,in no acute distress; alert,appropriate and cooperative throughout examination Abdomen:  Soft, gravid, consistent with dates.   Impression & Recommendations:  Problem # 1:  SUPERVISION OF NORMAL FIRST PREGNANCY (ICD-V22.0) Assessment Unchanged G3P0020 40.1 weeks by LMP Adequate weight gain so far. Normal Anatomy US  - Female baby Hb:11.5 -> 8.9 -> 8.9 ->10.5-> Plt:264 RPR: NR x2 Blood: A pos Ab: neg HepB: neg HIV: neg x2 Rubella: Immune GC/Chlam: neg/neg Sickle Cell trait Hx exposure to cat litter in beginning of pregnancy, Toxoplasma titers negative. Tolerating prenatal caplets. Educated on S&S prompting MAU visit. 1hGTT 150, 3h- normal GBS neg Desires to BF.  Desires micronor .  RTC 1 week.  Now needs Biweekly NST's, first 10/27/09 @ [facility] 0900 Induction scheduled 11/08/09 @7 :30pm  Orders: Medicaid OB visit - [facility] ([id number])  Problem # 2:  ANEMIA (ICD-285.9) Assessment: Improved Hb 10.5 at last visit, recheck today.  Her updated medication list for this problem includes:    Ferrous Gluconate 325 Mg Tabs (Ferrous gluconate) ..... 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  Baby has  25% chance of being a trait carrier, 0% chance of having disease.  Complete Medication List: 1)  Prefera Ob + Dha 22-6-1 & 200 Mg Misc (Prenat fepoly-fehempo-fa-dha) .... One tab by mouth daily 2)  Acetaminophen  650 Mg Cr-tabs (Acetaminophen ) .... One tab by mouth q8h as needed for pain. 3)  Ferrous Gluconate 325 Mg Tabs (Ferrous gluconate) .... One tab by mouth bid  Patient Instructions: 1)  Please schedule a follow-up appointment in 1 week. 2)  Go to [facility] if you have vaginal bleeding, rupture of membranes, painful contractions that are regular and occur every 4-5 minutes, or if you have any trauma to your abdomen. 3)  Do daily kick counts. 4)  Continue to take the iron pill 2x a day. 5)  I will send you to our lab for hemoglobin. 6)  Come back to see me in one week. 7)  Your first NST has been scheduled at [facility] clinics on Fri 10/27/09 @ 9:00 am, you need 2 per week until your induction. 8)  Your induction has been scheduled for Wed 11/08/09, be at [facility] and they will check you in and call me. 9)  -Dr. 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 test: 02/28/2009   Flowsheet View for Follow-up Visit    Estimated weeks of       gestation:     40 1/7    Weight:     139    Blood pressure:   115 / 76    Headache:     No    Nausea/vomiting:   No     Edema:     0    Vaginal bleeding:   no    Vaginal discharge:   no    Fundal height:      40    FHR:       139    Fetal activity:     yes    Labor symptoms:   no    Fetal position:     vertex    Cx Dilation:     1    Cx Effacement:   20%    Cx Station:     high    Taking prenatal vits?   Y    Smoking:     n/a    Next visit:     1 wk    Resident:     Curtis    Preceptor:     Chambliss    Comment:     Doing well, tolerating PNV, tol iron, no other issues. RTC 1 week. Induction and biweekly NSTs scheduled, see A/P    Flowsheet View for Follow-up Visit    Estimated weeks of       gestation:     40 1/7    Weight:     139    Blood pressure:   115 / 76    Hx headache?     No    Nausea/vomiting?   No    Edema?     0    Bleeding?     no    Leakage/discharge?   no    Fetal activity:       yes    Labor symptoms?   no    Fundal height:      40    FHR:       139    Fetal position:      vertex    Cx dilation:     1    Cx effacement:   20%    Fetal station:     high    Taking Vitamins?   Y    Smoking PPD:   n/a    Comment:     Doing well, tolerating PNV, tol iron, no other issues. RTC 1 week. 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 test:  02/28/2009   Laboratory Results   Blood Tests   Date/Time Received: October 25, 2009 9:33 AM  Date/Time Reported: October 25, 2009 9:45 AM     CBC   HGB:  8.8 g/dL   (Normal Range: 86.9-82.9 in Males, 12.0-15.0 in Females) Comments: capillary sample ...........test performed by ...............SABRAOlam KANDICE Keeling LPN

## 2011-01-22 NOTE — Letter (Signed)
 Summary: Generic Letter  [facility]  [address]   [place], [place] [address]   Phone: [phone]  Fax: [phone]    09/18/2009  MAKAYLIA HEWETT [address] [place], [place]  [address]  To whom it may concern,  Pending impending birth of his child, please allow Mr. Kanaan to keep his cell phone on during the work day so that he can be reached if his wife goes into labor.  I have been managing her labor for months now and they are both well known to me.  If you have any questions, feel free to contact me.     Sincerely,     Debby Petties MD

## 2011-01-22 NOTE — Assessment & Plan Note (Signed)
 Flowsheet View for Follow-up Visit    Estimated weeks of       gestation:     27 0/7    Weight:     121    Blood pressure:   97 / 63    Hx headache?     No    Nausea/vomiting?   No    Edema?     0    Bleeding?     no    Leakage/discharge?   no    Fetal activity:       yes    Labor symptoms?   no    Fundal height:      28    FHR:       135    Fetal position:      vertex    Taking Vitamins?   Y    Smoking PPD:   n/a    Comment:     Doing well, tolerating PNV, normal anatomy US , feels baby kick/move, went to MAU recently for pelvic pain, resolved after manipulation.  No other issues.  1hGTT today.  RTC 2 weeks.  Physical Examination  Vital Signs:  BP (upright): 97/63  Wt: 121    Impression & Recommendations:  Problem # 1:  SUPERVISION OF NORMAL FIRST PREGNANCY (ICD-V22.0) Assessment Unchanged 27.0 weeks by LMP No issues found. Adequate weight gain. Normal Anatomy US  - Female baby Hx exposure to cat litter in beginning of pregnancy, Toxoplasma titers negative. Tolerating prenatal caplets. Educated on S&S prompting MAU visit. Mother with sickle cell trait, still awaiting FOB results however he did recently have his blood drawn for the test.  Can complete genetic counselling when his results are in. 1HGTT today.  RTC 2 weeks.  Orders: Other OB visit- [facility] (OBCK)  Future Orders: Glucose 1 hr-[facility] (17049) ... 07/26/2009  Complete Medication List: 1)  Prefera Ob + Dha 22-6-1 & 200 Mg Misc (Prenat fepoly-fehempo-fa-dha) .... One tab by mouth daily 2)  Acetaminophen  650 Mg Cr-tabs (Acetaminophen ) .... One tab by mouth q8h as needed for pain.  Primary Care Provider:  Debby Petties MD   History of Present Illness: 25yo G3P0020 at 27.0 weeks here for routine OB visit.  See flowsheet for further details.     Patient Instructions: 1)  Great to see you today, 2)  Everything is going normally.   3)  If you have any bleeding, fluid leakage  then go to [facility] hospital  for evaluation. 4)  Cont to take your prenatal vitamins. 5)  Come back in 2 weeks. 6)  Come to the lab to have your 1h glucose test done. 7)  -Dr. IVAR [place] for Follow-up Visit    Estimated weeks of       gestation:     27 0/7    Weight:     121    Blood pressure:   97 / 63    Headache:     No    Nausea/vomiting:   No    Edema:     0    Vaginal bleeding:   no    Vaginal discharge:   no    Fundal height:      28    FHR:       135    Fetal activity:     yes    Labor symptoms:   no    Fetal position:     vertex    Taking prenatal vits?   Y  Smoking:     n/a    Comment:     Doing well, tolerating PNV, normal anatomy US , feels baby kick/move, went to MAU recently for pelvic pain, resolved after manipulation.  No other issues.  1hGTT today.  RTC 2 weeks.   Flowsheet View for Follow-up Visit    Estimated weeks of       gestation:     27 0/7    Weight:     121    Blood pressure:   97 / 63    Hx headache?     No    Nausea/vomiting?   No    Edema?     0    Bleeding?     no    Leakage/discharge?   no    Fetal activity:       yes    Labor symptoms?   no    Fundal height:      28    FHR:       135    Fetal position:      vertex    Taking Vitamins?   Y    Smoking PPD:   n/a    Comment:     Doing well, tolerating PNV, normal anatomy US , feels baby kick/move, went to MAU recently for pelvic pain, resolved after manipulation.  No other issues.  1hGTT today.  RTC 2 weeks.

## 2011-01-22 NOTE — Letter (Signed)
 Summary: Non Stress Test  Non Stress Test   Imported By: Madelin Daring 11/01/2009 16:26:43  _____________________________________________________________________  External Attachment:    Type:   Image     Comment:   External Document

## 2011-01-22 NOTE — Miscellaneous (Signed)
 Summary: [employer] forms  Clinical Lists Changes [facility]ml[facility] forms to pcp to complete.Raejean Mau RN  November 23, 2009 4:43 PM  Completed, in to-do box. Debby Petties MD  November 27, 2009 9:31 AM   Appended Document: [employer] forms faxed to company

## 2011-01-22 NOTE — Assessment & Plan Note (Signed)
 Summary: STAPLE REMOVAL PER DR T/DSL   Vital Signs:  Patient profile:   25 year old female Weight:      123..6 pounds Temp:     98.2 degrees F oral Pulse rate:   75 / minute BP sitting:   131 / 90  (right arm) Cuff size:   regular CC: Staple removal Is Patient Diabetic? No Pain Assessment Patient in pain? no        Primary Care Provider:  Debby Petties MD  CC:  Staple removal.  History of Present Illness: 23 G3P1021 here on POD#7 PLTCS for staple removal.  Baby is in NICU for episode of apnea in nursery, doing well, mothing doing well emotionally.  No complaints.  Minimal lochia, pain well controlled, some constipation but colace helps.  Keeping herself and husband busy preparing the house for baby's arrival today/tomorrow.  Habits & Providers  Alcohol-Tobacco-Diet     Tobacco Status: never  Past History:  Past Surgical History: PLTCS for failure to progress 2010.  Review of Systems       See HPI  Physical Exam  General:  Well-developed,well-nourished,in no acute distress; alert,appropriate and cooperative throughout examination Abdomen:  Bowel sounds positive,abdomen soft and non-tender without masses, organomegaly or hernias noted. Fundus firm.  Staples removed.   Impression & Recommendations:  Problem # 1:  ENCOUNTER FOR REMOVAL OF SUTURES (ICD-V58.32) Assessment New Removed staples, pt to RTC for 6 wk PP check.  Orders: [facility]- Est Level  3 (99213)  Complete Medication List: 1)  Prefera Ob + Dha 22-6-1 & 200 Mg Misc (Prenat fepoly-fehempo-fa-dha) .... One tab by mouth daily 2)  Acetaminophen  650 Mg Cr-tabs (Acetaminophen ) .... One tab by mouth q8h as needed for pain. 3)  Ferrous Gluconate 325 Mg Tabs (Ferrous gluconate) .... One tab by mouth bid  Patient Instructions: 1)  We took your staples out today. 2)  Great to see you , come back to see me at your 6 week follow up appointment. 3)  -Dr. ONEIDA.   Appended Document: Orders  Update    Clinical Lists Changes  Orders: Added new Service order of Est. Patient Level I (99211) - Signed

## 2011-01-23 ENCOUNTER — Encounter: Payer: Self-pay | Admitting: Sports Medicine

## 2011-01-23 ENCOUNTER — Other Ambulatory Visit: Payer: Self-pay | Admitting: Sports Medicine

## 2011-01-23 ENCOUNTER — Ambulatory Visit: Admit: 2011-01-23 | Payer: Self-pay

## 2011-01-23 ENCOUNTER — Encounter (INDEPENDENT_AMBULATORY_CARE_PROVIDER_SITE_OTHER): Payer: BC Managed Care – PPO | Admitting: Sports Medicine

## 2011-01-23 DIAGNOSIS — D649 Anemia, unspecified: Secondary | ICD-10-CM

## 2011-01-23 DIAGNOSIS — N912 Amenorrhea, unspecified: Secondary | ICD-10-CM

## 2011-01-23 DIAGNOSIS — Z01419 Encounter for gynecological examination (general) (routine) without abnormal findings: Secondary | ICD-10-CM | POA: Insufficient documentation

## 2011-01-23 DIAGNOSIS — IMO0002 Reserved for concepts with insufficient information to code with codable children: Secondary | ICD-10-CM

## 2011-01-23 DIAGNOSIS — Z3009 Encounter for other general counseling and advice on contraception: Secondary | ICD-10-CM

## 2011-01-23 DIAGNOSIS — Z3202 Encounter for pregnancy test, result negative: Secondary | ICD-10-CM

## 2011-01-23 HISTORY — DX: Reserved for concepts with insufficient information to code with codable children: IMO0002

## 2011-01-23 LAB — CONVERTED CEMR LAB
Beta hcg, urine, semiquantitative: NEGATIVE
Hemoglobin: 13 g/dL

## 2011-01-23 NOTE — Assessment & Plan Note (Signed)
Most likely due to cont'd breastfeeding. When she stops feeding if no return of regular menstruation or other cyclical changes then would have concern for mechanical obstruction vs primary vs secondary ovarian failure (Sheehan syndrome). Would do fertility panel if gets to this.

## 2011-01-23 NOTE — Progress Notes (Signed)
  Subjective:    Patient ID: Morgan Todd, female    DOB: 12/25/1985, 25 y.o.   MRN: [id number]  HPI Well woman exam:  Doing well, still breastfeeding, concerned that she has not yet had a period.  She is also not yet getting the cyclical breast and other body changes associated with menstrual cycles.  Her last DEPO shot was Sept 2011.  No other concerns.  Anemia:  No SOB/fatigue, last Hb last year was 11.   Review of Systems    Neg except as in HPI. Objective:   Physical Exam  Constitutional: She is oriented to person, place, and time. She appears well-developed and well-nourished. No distress.  HENT:  Head: Normocephalic and atraumatic.  Right Ear: External ear normal.  Left Ear: External ear normal.  Nose: Nose normal.  Mouth/Throat: Oropharynx is clear and moist.  Eyes: Conjunctivae and EOM are normal. Pupils are equal, round, and reactive to light.  Neck: Normal range of motion. Neck supple.  Cardiovascular: Normal rate, regular rhythm, normal heart sounds and intact distal pulses.   Pulmonary/Chest: Effort normal and breath sounds normal. She has no wheezes.  Abdominal: Bowel sounds are normal. She exhibits no distension and no mass. There is no tenderness. There is no rebound and no guarding.  Genitourinary: Vagina normal and uterus normal. No vaginal discharge found.         Some clotted blood noted around cervix in vault.  Musculoskeletal: Normal range of motion. She exhibits no edema.  Neurological: She is alert and oriented to person, place, and time.  Skin: Skin is warm and dry. No rash noted.          Assessment & Plan:

## 2011-01-23 NOTE — Assessment & Plan Note (Signed)
Otherwise normal exam. PAP done. Repeat in a year, if still neg can space out to q2-3 years.

## 2011-01-23 NOTE — Assessment & Plan Note (Signed)
Last Hb 11, will recheck today. Still taking ferrous gluconate, can DC if Hb normalized.

## 2011-01-28 DIAGNOSIS — R8789 Other abnormal findings in specimens from female genital organs: Secondary | ICD-10-CM

## 2011-01-28 HISTORY — DX: Other abnormal findings in specimens from female genital organs: R87.89

## 2011-01-29 ENCOUNTER — Telehealth: Payer: Self-pay | Admitting: Sports Medicine

## 2011-01-30 NOTE — Assessment & Plan Note (Signed)
Summary: cpe  depo given next depo due 04/19 thru 05/03.Loralee Pacas CMA  January 23, 2011 10:30 AM  Vital Signs:  Patient profile:   25 year old female Height:      60 inches Weight:      127 pounds BMI:     24.89 Temp:     98.3 degrees F oral Pulse rate:   62 / minute Pulse rhythm:   regular BP sitting:   113 / 74  (left arm) Cuff size:   regular  Vitals Entered By: Loralee Pacas CMA (January 23, 2011 9:23 AM) CC: cpe Is Patient Diabetic? No Pain Assessment Patient in pain? no        Primary Care Provider:  Rodney Langton MD  CC:  cpe.  History of Present Illness: HPI Well woman exam:  Doing well, still breastfeeding, concerned that she has not yet had a period.  She is also not yet getting the cyclical breast and other body changes associated with menstrual cycles.  Her last DEPO shot was Sept 2011.  No other concerns.   Anemia:  No SOB/fatigue, last Hb last year was 11.     Review of Systems   Neg except as in HPI. Objective:    Physical Exam  Constitutional: She is oriented to person, place, and time. She appears well-developed and well-nourished. No distress.  HENT:   Head: Normocephalic and atraumatic.  Right Ear: External ear normal.  Left Ear: External ear normal.   Nose: Nose normal.   Mouth/Throat: Oropharynx is clear and moist.  Eyes: Conjunctivae and EOM are normal. Pupils are equal, round, and reactive to light.  Neck: Normal range of motion. Neck supple.  Cardiovascular: Normal rate, regular rhythm, normal heart sounds and intact distal pulses.   Pulmonary/Chest: Effort normal and breath sounds normal. She has no wheezes.  Abdominal: Bowel sounds are normal. She exhibits no distension and no mass. There is no tenderness. There is no rebound and no guarding.  Genitourinary: Vagina normal and uterus normal. No vaginal discharge found.  Some clotted blood noted around cervix in vault.  No LE edema.    Well woman exam -  [facility]  01/23/2011 11:57 AM  Signed Otherwise normal exam. PAP done. Repeat in a year, if still neg can space out to q2-3 years.  AMENORRHEA - [facility]  01/23/2011 11:56 AM  Signed Most likely due to cont'd breastfeeding. When she stops feeding if no return of regular menstruation or other cyclical changes then would have concern for mechanical obstruction vs primary vs secondary ovarian failure (Sheehan syndrome). Would do fertility panel if gets to this.  ANEMIA - [facility]  01/23/2011 11:55 AM  Signed Last Hb 11, will recheck today--> 13 Still taking ferrous gluconate, can DC.   Habits & Providers  Alcohol-Tobacco-Diet     Tobacco Status: never     Cigarette Packs/Day: n/a  Exercise-Depression-Behavior     Have you felt down or hopeless? no     Have you felt little pleasure in things? no     Depression Counseling: not indicated; screening negative for depression     Seat Belt Use: always  Allergies: No Known Drug Allergies  Social History: Risk analyst Use:  always   Complete Medication List: 1)  Prefera Ob + Dha 22-6-1 & 200 Mg Misc (Prenat fepoly-fehempo-fa-dha) .... One tab by mouth daily 2)  Acetaminophen 650 Mg Cr-tabs (Acetaminophen) .... One tab by mouth q8h as needed for pain.  Other Orders: U Preg-[facility] (  81025) [facility] - Est  18-39 yrs (21308) Hemoglobin-[facility] [phone]) Depo-Provera 150mg  ([id number])   Medication Administration  Injection # 1:    Medication: Depo-Provera 150mg     Diagnosis: CONTRACEPTIVE MANAGEMENT (ICD-V25.09)    Route: IM    Site: LUOQ gluteus    Exp Date: 11/21/2013    Lot #: [id number]    Mfr: greenstone    Comments: pt to rtc 04/19-05/02/2011    Patient tolerated injection without complications    Given by: Loralee Pacas CMA (January 23, 2011 10:31 AM)  Orders Added: 1)  U Preg-[facility] [81025] 2)  [facility] - Est  18-39 yrs [99395] 3)  Hemoglobin-[facility] [85018] 4)  Depo-Provera 150mg  [[id number]]    Last PAP:  NEGATIVE FOR  INTRAEPITHELIAL LESIONS OR MALIGNANCY. (04/03/2009 12:00:00 AM) PAP Next Due:  1 yr   Laboratory Results   Urine Tests  Date/Time Received: January 23, 2011 9:30 AM  Date/Time Reported: January 23, 2011 9:55 AM     Urine HCG: negative Comments: ...............test performed by......Marland KitchenBonnie A. [place], MLS (ASCP)cm   Blood Tests   Date/Time Received: January 23, 2011 10:18 AM  Date/Time Reported: January 23, 2011 10:23 AM     CBC   HGB:  13.0 g/dL   (Normal Range: 32.4-40.1 in Males, 12.0-15.0 in Females) Comments: ...............test performed by......Marland KitchenBonnie A. [place], MLS (ASCP)cm

## 2011-02-07 NOTE — Progress Notes (Signed)
Summary: Pt informed of PAP results  Phone Note Outgoing Call Call back at [facility] Phone [phone]   Call placed by: Rodney Langton MD,  January 29, 2011 8:31 AM Call placed to: Patient Summary of Call: Called pt re: LGSIL on PAP.  Discussed what this means and that colposcopy will be required.  Explained what a colposcopy was and what the procedure involves.  Pt was understanding and will call to make an appt with Dr. Jennette Kettle for colpo clinic. Initial call taken by: Rodney Langton MD,  January 29, 2011 8:31 AM

## 2011-02-14 ENCOUNTER — Encounter: Payer: Self-pay | Admitting: Family Medicine

## 2011-02-14 ENCOUNTER — Ambulatory Visit (INDEPENDENT_AMBULATORY_CARE_PROVIDER_SITE_OTHER): Payer: BC Managed Care – PPO | Admitting: Family Medicine

## 2011-02-14 VITALS — BP 121/79 | HR 70 | Temp 98.1°F | Wt 128.1 lb

## 2011-02-14 DIAGNOSIS — IMO0002 Reserved for concepts with insufficient information to code with codable children: Secondary | ICD-10-CM

## 2011-02-14 DIAGNOSIS — R87612 Low grade squamous intraepithelial lesion on cytologic smear of cervix (LGSIL): Secondary | ICD-10-CM

## 2011-02-14 NOTE — Progress Notes (Signed)
  Subjective:    Patient ID: Morgan Todd, female    DOB: [birth year], 25 y.o.   MRN: [id number]  HPI  LGSIL on pap Here for colpo Still breast feeding  Review of Systems     Objective:   Physical Exam     GU externally normal Patient given informed consent, signed copy in the chart. Placed in lithotomy position. Cervix viewed with speculum and colposcope. Was the entire squamocolumnar junction seen?yes Any acetowhite lesions noted?no Any abnormalities seen with green filter?no Any abnormalities seen with application of Lugol's solution?no Was the endocervical canal sampled?no Were any cervical biopsies taken?no Were there any complications?no COMMENTS: Patient was given post procedure instructions.     Assessment & Plan:  LGSIL on pap Clinically normal Colpo return to screening pap in one year

## 2011-03-13 ENCOUNTER — Ambulatory Visit: Payer: BC Managed Care – PPO

## 2011-03-27 LAB — CBC
HCT: 33.7 % — ABNORMAL LOW (ref 36.0–46.0)
MCHC: 33.5 g/dL (ref 30.0–36.0)
Platelets: 208 10*3/uL (ref 150–400)
RBC: 3.27 MIL/uL — ABNORMAL LOW (ref 3.87–5.11)
RDW: 15.4 % (ref 11.5–15.5)
WBC: 22.7 10*3/uL — ABNORMAL HIGH (ref 4.0–10.5)

## 2011-03-27 LAB — CCBB MATERNAL DONOR DRAW

## 2011-03-27 LAB — RPR: RPR Ser Ql: NONREACTIVE

## 2011-03-30 LAB — GLUCOSE, CAPILLARY
Glucose-Capillary: 150 mg/dL — ABNORMAL HIGH (ref 70–99)
Glucose-Capillary: 81 mg/dL (ref 70–99)

## 2011-04-04 LAB — WET PREP, GENITAL: Yeast Wet Prep HPF POC: NONE SEEN

## 2011-04-04 LAB — URINALYSIS, ROUTINE W REFLEX MICROSCOPIC
Hgb urine dipstick: NEGATIVE
Protein, ur: NEGATIVE mg/dL
Urobilinogen, UA: 0.2 mg/dL (ref 0.0–1.0)

## 2011-04-04 LAB — GC/CHLAMYDIA PROBE AMP, GENITAL: GC Probe Amp, Genital: NEGATIVE

## 2011-05-16 ENCOUNTER — Other Ambulatory Visit: Payer: Self-pay | Admitting: Sports Medicine

## 2011-05-16 ENCOUNTER — Ambulatory Visit (INDEPENDENT_AMBULATORY_CARE_PROVIDER_SITE_OTHER): Payer: BC Managed Care – PPO | Admitting: *Deleted

## 2011-05-16 DIAGNOSIS — Z309 Encounter for contraceptive management, unspecified: Secondary | ICD-10-CM

## 2011-05-16 LAB — POCT URINE PREGNANCY: Preg Test, Ur: NEGATIVE

## 2011-05-16 MED ORDER — MEDROXYPROGESTERONE ACETATE 150 MG/ML IM SUSP: 150.0000 mg | INTRAMUSCULAR | Status: DC

## 2011-05-16 MED ORDER — MEDROXYPROGESTERONE ACETATE 150 MG/ML IM SUSP
150.0000 mg | Freq: Once | INTRAMUSCULAR | Status: AC
  Administered 2011-05-16: 150 mg via INTRAMUSCULAR

## 2011-05-16 NOTE — Progress Notes (Signed)
Patient  Is late getting depo. She has had sex in past 5 days , offered emergency contraception but she is not interested. Advised  extra protection for 7 days. Return in 2 weeks for repeat  urine pregnancy test.

## 2011-08-02 ENCOUNTER — Ambulatory Visit (INDEPENDENT_AMBULATORY_CARE_PROVIDER_SITE_OTHER): Payer: BC Managed Care – PPO | Admitting: Family Medicine

## 2011-08-02 VITALS — BP 120/80 | HR 80 | Temp 98.1°F | Ht 60.5 in | Wt 129.7 lb

## 2011-08-02 DIAGNOSIS — R300 Dysuria: Secondary | ICD-10-CM

## 2011-08-02 LAB — POCT WET PREP (WET MOUNT): Yeast Wet Prep HPF POC: NEGATIVE

## 2011-08-02 LAB — POCT URINALYSIS DIPSTICK: Spec Grav, UA: 1.015

## 2011-08-02 LAB — POCT UA - MICROSCOPIC ONLY

## 2011-08-02 MED ORDER — CEPHALEXIN 500 MG PO CAPS: 500.0000 mg | ORAL_CAPSULE | Freq: Two times a day (BID) | ORAL | Status: AC

## 2011-08-02 NOTE — Progress Notes (Signed)
  Subjective:    Patient ID: Morgan Todd, female    DOB: [birth year], 25 y.o.   MRN: [id number]  HPI  4-5 days of dysuria, increased urge and frequency.  No fevers, no back pain.  Some irriation adn itch at introitus.  No d/c no odor.  No new partners.  manogamous relationship  Review of Systems Denies CP, SOB, HA, N/V/D, fever     Objective:   Physical Exam Vital signs reviewed General appearance - alert, well appearing, and in no distress and oriented to person, place, and time GYN- external genetalia normal, without lesions.  Vagina normal color, rugations, minimal white discharge.  Cervix normal color without lesions or discharge Abdomen - soft, nontender, nondistended, no masses or organomegaly        Assessment & Plan:  Dysuria Check u/a and wet prep.  Could have yeast contributing.  Will call pt with results

## 2011-08-02 NOTE — Assessment & Plan Note (Signed)
Check u/a and wet prep.  Could have yeast contributing.  Will call pt with results

## 2011-08-05 LAB — URINE CULTURE

## 2012-11-25 ENCOUNTER — Encounter: Payer: Self-pay | Admitting: Family Medicine

## 2012-11-25 ENCOUNTER — Ambulatory Visit (INDEPENDENT_AMBULATORY_CARE_PROVIDER_SITE_OTHER): Payer: 59 | Admitting: Family Medicine

## 2012-11-25 VITALS — BP 107/64 | HR 75 | Temp 98.8°F | Ht 60.0 in | Wt 113.0 lb

## 2012-11-25 DIAGNOSIS — N912 Amenorrhea, unspecified: Secondary | ICD-10-CM

## 2012-11-25 DIAGNOSIS — Z331 Pregnant state, incidental: Secondary | ICD-10-CM

## 2012-11-25 LAB — POCT URINE PREGNANCY: Preg Test, Ur: POSITIVE

## 2012-11-25 LAB — HIV ANTIBODY (ROUTINE TESTING W REFLEX): HIV: NONREACTIVE

## 2012-11-25 NOTE — Patient Instructions (Addendum)
Based on your last menstrual period, your due date will be July 08, 2013.    Today you are 7 weeks and 6 days pregnant.   We will get labs today and then get you scheduled for your first OB appt.    It was good to see you and congratulations!

## 2012-11-25 NOTE — Progress Notes (Signed)
  Subjective:    Patient ID: Morgan Todd, female    DOB: [birth year], 26 y.o.   MRN: [id number]  HPI  1.  Concern for pregnancy:  [id number] who presents today for confirmation of positive pregnancy test at home.  LMP was 10/11/2012.  Had some nausea without vomiting this AM, first time for her.  Taking daily multivitamin.  No EtOH use.  No abdominal pain, dysuria, vaginal discharge, or bleeding.  No leg swelling.   Review of Systems See HPI above for review of systems.       Objective:   Physical Exam Gen:  Alert, cooperative patient who appears stated age in no acute distress.  Vital signs reviewed. Cardiac:  Regular rate and rhythm without murmur auscultated.  Good S1/S2. Pulm:  Clear to auscultation bilaterally with good air movement.  No wheezes or rales noted.   Abdomen:  Nontender Ext:  No edema noted.        Assessment & Plan:

## 2012-11-26 DIAGNOSIS — Z3480 Encounter for supervision of other normal pregnancy, unspecified trimester: Secondary | ICD-10-CM | POA: Insufficient documentation

## 2012-11-26 DIAGNOSIS — Z3490 Encounter for supervision of normal pregnancy, unspecified, unspecified trimester: Secondary | ICD-10-CM | POA: Insufficient documentation

## 2012-11-26 NOTE — Assessment & Plan Note (Signed)
Positive pregnancy test. Recommended start prenatal vitamins, counseled no smoking/no EtOH.   Will obtain prenatal screen and urine culture today.   FU with PCP in couple of weeks for first formal prenatal visit.   No bleeding, abdominal pain, no red flags.

## 2012-11-27 LAB — OBSTETRIC PANEL
Antibody Screen: NEGATIVE
Eosinophils Relative: 2 % (ref 0–5)
HCT: 35.5 % — ABNORMAL LOW (ref 36.0–46.0)
Hemoglobin: 12.1 g/dL (ref 12.0–15.0)
Lymphocytes Relative: 31 % (ref 12–46)
Lymphs Abs: 0.9 10*3/uL (ref 0.7–4.0)
MCV: 85.1 fL (ref 78.0–100.0)
Monocytes Absolute: 0.4 10*3/uL (ref 0.1–1.0)
Monocytes Relative: 15 % — ABNORMAL HIGH (ref 3–12)
Neutro Abs: 1.5 10*3/uL — ABNORMAL LOW (ref 1.7–7.7)
RBC: 4.17 MIL/uL (ref 3.87–5.11)
RDW: 13.1 % (ref 11.5–15.5)
Rh Type: POSITIVE
Rubella: 26.1 Index — ABNORMAL HIGH (ref <0.90)
WBC: 2.8 10*3/uL — ABNORMAL LOW (ref 4.0–10.5)

## 2012-12-14 ENCOUNTER — Telehealth: Payer: Self-pay | Admitting: Family Medicine

## 2012-12-14 NOTE — Telephone Encounter (Signed)
NOB appt scheduled with Dr. Berline Chough 12/30/2011 @ 9:30am.  Ileana Ladd

## 2012-12-14 NOTE — Telephone Encounter (Signed)
Patient is a NOB patient waiting for her first appt.

## 2012-12-29 ENCOUNTER — Encounter: Payer: Self-pay | Admitting: Sports Medicine

## 2012-12-29 ENCOUNTER — Other Ambulatory Visit (HOSPITAL_COMMUNITY)
Admission: RE | Admit: 2012-12-29 | Discharge: 2012-12-29 | Disposition: A | Discharge status: Home / Self Care | Payer: 59 | Source: Ambulatory Visit | Attending: Family Medicine | Admitting: Family Medicine

## 2012-12-29 ENCOUNTER — Ambulatory Visit (INDEPENDENT_AMBULATORY_CARE_PROVIDER_SITE_OTHER): Payer: 59 | Admitting: Sports Medicine

## 2012-12-29 VITALS — BP 107/70 | Temp 98.3°F | Wt 115.6 lb

## 2012-12-29 DIAGNOSIS — Z113 Encounter for screening for infections with a predominantly sexual mode of transmission: Secondary | ICD-10-CM | POA: Insufficient documentation

## 2012-12-29 DIAGNOSIS — Z331 Pregnant state, incidental: Secondary | ICD-10-CM

## 2012-12-29 DIAGNOSIS — Z3480 Encounter for supervision of other normal pregnancy, unspecified trimester: Secondary | ICD-10-CM

## 2012-12-29 DIAGNOSIS — Z3490 Encounter for supervision of normal pregnancy, unspecified, unspecified trimester: Secondary | ICD-10-CM

## 2012-12-29 DIAGNOSIS — Z23 Encounter for immunization: Secondary | ICD-10-CM

## 2012-12-29 DIAGNOSIS — Z01419 Encounter for gynecological examination (general) (routine) without abnormal findings: Secondary | ICD-10-CM | POA: Insufficient documentation

## 2012-12-29 DIAGNOSIS — D573 Sickle-cell trait: Secondary | ICD-10-CM

## 2012-12-29 LAB — POCT URINALYSIS DIPSTICK
Bilirubin, UA: NEGATIVE
Ketones, UA: NEGATIVE
Nitrite, UA: NEGATIVE
pH, UA: 7

## 2012-12-29 LAB — POCT UA - MICROSCOPIC ONLY

## 2012-12-29 NOTE — Patient Instructions (Addendum)
It was nice to meet you. Please follow up in 2 weeks after your ultrasound to review the results and to confirm your dating. Please see below!    ABCs of Pregnancy A Antepartum care is very important. Be sure you see your doctor and get prenatal care as soon as you think you are pregnant. At this time, you will be tested for infection, genetic abnormalities and potential problems with you and the pregnancy. This is the time to discuss diet, exercise, work, medications, labor, pain medication during labor and the possibility of a cesarean delivery. Ask any questions that may concern you. It is important to see your doctor regularly throughout your pregnancy. Avoid exposure to toxic substances and chemicals - such as cleaning solvents, lead and mercury, some insecticides, and paint. Pregnant women should avoid exposure to paint fumes, and fumes that cause you to feel ill, dizzy or faint. When possible, it is a good idea to have a pre-pregnancy consultation with your caregiver to begin some important recommendations your caregiver suggests such as, taking folic acid, exercising, quitting smoking, avoiding alcoholic beverages, etc. B Breastfeeding is the healthiest choice for both you and your baby. It has many nutritional benefits for the baby and health benefits for the mother. It also creates a very tight and loving bond between the baby and mother. Talk to your doctor, your family and friends, and your employer about how you choose to feed your baby and how they can support you in your decision. Not all birth defects can be prevented, but a woman can take actions that may increase her chance of having a healthy baby. Many birth defects happen very early in pregnancy, sometimes before a woman even knows she is pregnant. Birth defects or abnormalities of any child in your or the father's family should be discussed with your caregiver. Get a good support bra as your breast size changes. Wear it especially  when you exercise and when nursing.  C Celebrate the news of your pregnancy with the your spouse/father and family. Childbirth classes are helpful to take for you and the spouse/father because it helps to understand what happens during the pregnancy, labor and delivery. Cesarean delivery should be discussed with your doctor so you are prepared for that possibility. The pros and cons of circumcision if it is a boy, should be discussed with your pediatrician. Cigarette smoking during pregnancy can result in low birth weight babies. It has been associated with infertility, miscarriages, tubal pregnancies, infant death (mortality) and poor health (morbidity) in childhood. Additionally, cigarette smoking may cause long-term learning disabilities. If you smoke, you should try to quit before getting pregnant and not smoke during the pregnancy. Secondary smoke may also harm a mother and her developing baby. It is a good idea to ask people to stop smoking around you during your pregnancy and after the baby is born. Extra calcium is necessary when you are pregnant and is found in your prenatal vitamin, in dairy products, green leafy vegetables and in calcium supplements. D A healthy diet according to your current weight and height, along with vitamins and mineral supplements should be discussed with your caregiver. Domestic abuse or violence should be made known to your doctor right away to get the situation corrected. Drink more water when you exercise to keep hydrated. Discomfort of your back and legs usually develops and progresses from the middle of the second trimester through to delivery of the baby. This is because of the enlarging baby and uterus,  which may also affect your balance. Do not take illegal drugs. Illegal drugs can seriously harm the baby and you. Drink extra fluids (water is best) throughout pregnancy to help your body keep up with the increases in your blood volume. Drink at least 6 to 8 glasses of  water, fruit juice, or milk each day. A good way to know you are drinking enough fluid is when your urine looks almost like clear water or is very light yellow.  E Eat healthy to get the nutrients you and your unborn baby need. Your meals should include the five basic food groups. Exercise (30 minutes of light to moderate exercise a day) is important and encouraged during pregnancy, if there are no medical problems or problems with the pregnancy. Exercise that causes discomfort or dizziness should be stopped and reported to your caregiver. Emotions during pregnancy can change from being ecstatic to depression and should be understood by you, your partner and your family. F Fetal screening with ultrasound, amniocentesis and monitoring during pregnancy and labor is common and sometimes necessary. Take 400 micrograms of folic acid daily both before, when possible, and during the first few months of pregnancy to reduce the risk of birth defects of the brain and spine. All women who could possibly become pregnant should take a vitamin with folic acid, every day. It is also important to eat a healthy diet with fortified foods (enriched grain products, including cereals, rice, breads, and pastas) and foods with natural sources of folate (orange juice, green leafy vegetables, beans, peanuts, broccoli, asparagus, peas, and lentils). The father should be involved with all aspects of the pregnancy including, the prenatal care, childbirth classes, labor, delivery, and postpartum time. Fathers may also have emotional concerns about being a father, financial needs, and raising a family. G Genetic testing should be done appropriately. It is important to know your family and the father's history. If there have been problems with pregnancies or birth defects in your family, report these to your doctor. Also, genetic counselors can talk with you about the information you might need in making decisions about having a family. You  can call a major medical center in your area for help in finding a board-certified genetic counselor. Genetic testing and counseling should be done before pregnancy when possible, especially if there is a history of problems in the mother's or father's family. Certain ethnic backgrounds are more at risk for genetic defects. H Get familiar with the hospital where you will be having your baby. Get to know how long it takes to get there, the labor and delivery area, and the hospital procedures. Be sure your medical insurance is accepted there. Get your home ready for the baby including, clothes, the baby's room (when possible), furniture and car seat. Hand washing is important throughout the day, especially after handling raw meat and poultry, changing the baby's diaper or using the bathroom. This can help prevent the spread of many bacteria and viruses that cause infection. Your hair may become dry and thinner, but will return to normal a few weeks after the baby is born. Heartburn is a common problem that can be treated by taking antacids recommended by your caregiver, eating smaller meals 5 or 6 times a day, not drinking liquids when eating, drinking between meals and raising the head of your bed 2 to 3 inches. I Insurance to cover you, the baby, doctor and hospital should be reviewed so that you will be prepared to pay any costs not  covered by your insurance plan. If you do not have medical insurance, there are usually clinics and services available for you in your community. Take 30 milligrams of iron during your pregnancy as prescribed by your doctor to reduce the risk of low red blood cells (anemia) later in pregnancy. All women of childbearing age should eat a diet rich in iron. J There should be a joint effort for the mother, father and any other children to adapt to the pregnancy financially, emotionally, and psychologically during the pregnancy. Join a support group for moms-to-be. Or, join a class on  parenting or childbirth. Have the family participate when possible. K Know your limits. Let your caregiver know if you experience any of the following:   Pain of any kind.  Strong cramps.  You develop a lot of weight in a short period of time (5 pounds in 3 to 5 days).  Vaginal bleeding, leaking of amniotic fluid.  Headache, vision problems.  Dizziness, fainting, shortness of breath.  Chest pain.  Fever of 102 F (38.9 C) or higher.  Gush of clear fluid from your vagina.  Painful urination.  Domestic violence.  Irregular heartbeat (palpitations).  Rapid beating of the heart (tachycardia).  Constant feeling sick to your stomach (nauseous) and vomiting.  Trouble walking, fluid retention (edema).  Muscle weakness.  If your baby has decreased activity.  Persistent diarrhea.  Abnormal vaginal discharge.  Uterine contractions at 20-minute intervals.  Back pain that travels down your leg. L Learn and practice that what you eat and drink should be in moderation and healthy for you and your baby. Legal drugs such as alcohol and caffeine are important issues for pregnant women. There is no safe amount of alcohol a woman can drink while pregnant. Fetal alcohol syndrome, a disorder characterized by growth retardation, facial abnormalities, and central nervous system dysfunction, is caused by a woman's use of alcohol during pregnancy. Caffeine, found in tea, coffee, soft drinks and chocolate, should also be limited. Be sure to read labels when trying to cut down on caffeine during pregnancy. More than 200 foods, beverages, and over-the-counter medications contain caffeine and have a high salt content! There are coffees and teas that do not contain caffeine. M Medical conditions such as diabetes, epilepsy, and high blood pressure should be treated and kept under control before pregnancy when possible, but especially during pregnancy. Ask your caregiver about any medications that  may need to be changed or adjusted during pregnancy. If you are currently taking any medications, ask your caregiver if it is safe to take them while you are pregnant or before getting pregnant when possible. Also, be sure to discuss any herbs or vitamins you are taking. They are medicines, too! Discuss with your doctor all medications, prescribed and over-the-counter, that you are taking. During your prenatal visit, discuss the medications your doctor may give you during labor and delivery. N Never be afraid to ask your doctor or caregiver questions about your health, the progress of the pregnancy, family problems, stressful situations, and recommendation for a pediatrician, if you do not have one. It is better to take all precautions and discuss any questions or concerns you may have during your office visits. It is a good idea to write down your questions before you visit the doctor. O Over-the-counter cough and cold remedies may contain alcohol or other ingredients that should be avoided during pregnancy. Ask your caregiver about prescription, herbs or over-the-counter medications that you are taking or may consider taking  while pregnant.  P Physical activity during pregnancy can benefit both you and your baby by lessening discomfort and fatigue, providing a sense of well-being, and increasing the likelihood of early recovery after delivery. Light to moderate exercise during pregnancy strengthens the belly (abdominal) and back muscles. This helps improve posture. Practicing yoga, walking, swimming, and cycling on a stationary bicycle are usually safe exercises for pregnant women. Avoid scuba diving, exercise at high altitudes (over 3000 feet), skiing, horseback riding, contact sports, etc. Always check with your doctor before beginning any kind of exercise, especially during pregnancy and especially if you did not exercise before getting pregnant. Q Queasiness, stomach upset and morning sickness are  common during pregnancy. Eating a couple of crackers or dry toast before getting out of bed. Foods that you normally love may make you feel sick to your stomach. You may need to substitute other nutritious foods. Eating 5 or 6 small meals a day instead of 3 large ones may make you feel better. Do not drink with your meals, drink between meals. Questions that you have should be written down and asked during your prenatal visits. R Read about and make plans to baby-proof your home. There are important tips for making your home a safer environment for your baby. Review the tips and make your home safer for you and your baby. Read food labels regarding calories, salt and fat content in the food. S Saunas, hot tubs, and steam rooms should be avoided while you are pregnant. Excessive high heat may be harmful during your pregnancy. Your caregiver will screen and examine you for sexually transmitted diseases and genetic disorders during your prenatal visits. Learn the signs of labor. Sexual relations while pregnant is safe unless there is a medical or pregnancy problem and your caregiver advises against it. T Traveling long distances should be avoided especially in the third trimester of your pregnancy. If you do have to travel out of state, be sure to take a copy of your medical records and medical insurance plan with you. You should not travel long distances without seeing your doctor first. Most airlines will not allow you to travel after 36 weeks of pregnancy. Toxoplasmosis is an infection caused by a parasite that can seriously harm an unborn baby. Avoid eating undercooked meat and handling cat litter. Be sure to wear gloves when gardening. Tingling of the hands and fingers is not unusual and is due to fluid retention. This will go away after the baby is born. U Womb (uterus) size increases during the first trimester. Your kidneys will begin to function more efficiently. This may cause you to feel the need to  urinate more often. You may also leak urine when sneezing, coughing or laughing. This is due to the growing uterus pressing against your bladder, which lies directly in front of and slightly under the uterus during the first few months of pregnancy. If you experience burning along with frequency of urination or bloody urine, be sure to tell your doctor. The size of your uterus in the third trimester may cause a problem with your balance. It is advisable to maintain good posture and avoid wearing high heels during this time. An ultrasound of your baby may be necessary during your pregnancy and is safe for you and your baby. V Vaccinations are an important concern for pregnant women. Get needed vaccines before pregnancy. Center for Disease Control ([employer]) has clear guidelines for the use of vaccines during pregnancy. Review the list, be sure to  discuss it with your doctor. Prenatal vitamins are helpful and healthy for you and the baby. Do not take extra vitamins except what is recommended. Taking too much of certain vitamins can cause overdose problems. Continuous vomiting should be reported to your caregiver. Varicose veins may appear especially if there is a family history of varicose veins. They should subside after the delivery of the baby. Support hose helps if there is leg discomfort. W Being overweight or underweight during pregnancy may cause problems. Try to get within 15 pounds of your ideal weight before pregnancy. Remember, pregnancy is not a time to be dieting! Do not stop eating or start skipping meals as your weight increases. Both you and your baby need the calories and nutrition you receive from a healthy diet. Be sure to consult with your doctor about your diet. There is a formula and diet plan available depending on whether you are overweight or underweight. Your caregiver or nutritionist can help and advise you if necessary. X Avoid X-rays. If you must have dental work or diagnostic  tests, tell your dentist or physician that you are pregnant so that extra care can be taken. X-rays should only be taken when the risks of not taking them outweigh the risk of taking them. If needed, only the minimum amount of radiation should be used. When X-rays are necessary, protective lead shields should be used to cover areas of the body that are not being X-rayed. Y Your baby loves you. Breastfeeding your baby creates a loving and very close bond between the two of you. Give your baby a healthy environment to live in while you are pregnant. Infants and children require constant care and guidance. Their health and safety should be carefully watched at all times. After the baby is born, rest or take a nap when the baby is sleeping. Z Get your ZZZs. Be sure to get plenty of rest. Resting on your side as often as possible, especially on your left side is advised. It provides the best circulation to your baby and helps reduce swelling. Try taking a nap for 30 to 45 minutes in the afternoon when possible. After the baby is born rest or take a nap when the baby is sleeping. Try elevating your feet for that amount of time when possible. It helps the circulation in your legs and helps reduce swelling.  Most information courtesy of the CDC. Document Released: 12/09/2005 Document Revised: 03/02/2012 Document Reviewed: 08/23/2009 [facility] Patient Information 2013 [place], [place].

## 2012-12-29 NOTE — Progress Notes (Signed)
Subjective:    Morgan Todd is being seen today for her first obstetrical visit.  This is not a planned pregnancy but is wanted. She is at [redacted]w[redacted]d gestation. Her obstetrical history is significant for failure to progress. Relationship with FOB: spouse, living together. Patient does intend to breast feed. Pregnancy history fully reviewed.  Patient reports no complaints.  Review of Systems:   Review of Systems  Constitutional: Negative for fever and fatigue.  HENT: Negative for congestion, rhinorrhea, sneezing and postnasal drip.   Eyes: Negative for visual disturbance.  Respiratory: Negative for choking and shortness of breath.   Cardiovascular: Negative for chest pain, palpitations and leg swelling.  Gastrointestinal: Negative for nausea, vomiting, abdominal pain and diarrhea.  Genitourinary: Negative for dysuria, frequency, hematuria, vaginal bleeding, vaginal discharge and vaginal pain.  Musculoskeletal: Negative for arthralgias.  Neurological: Negative for syncope, light-headedness and headaches.  Psychiatric/Behavioral: Negative for behavioral problems and agitation.    Objective:     BP 107/70  Temp 98.3 F (36.8 C)  Wt 115 lb 9.6 oz (52.436 kg)  LMP 09/25/2012 Physical Exam  Constitutional: She is oriented to person, place, and time. She appears well-developed and well-nourished. No distress.  HENT:  Head: Normocephalic and atraumatic.  Right Ear: External ear normal.  Left Ear: External ear normal.  Mouth/Throat: Oropharynx is clear and moist.       Poor dentention  Eyes: Conjunctivae normal are normal. Pupils are equal, round, and reactive to light. Right eye exhibits no discharge. Left eye exhibits no discharge. No scleral icterus.  Neck: Normal range of motion. Neck supple.  Cardiovascular: Normal rate, regular rhythm, normal heart sounds and intact distal pulses.  Exam reveals no gallop and no friction rub.   No murmur heard. Respiratory: Effort normal and breath  sounds normal. No respiratory distress. She has no wheezes. She has no rales. She exhibits no tenderness.  GI: Soft. Bowel sounds are normal. She exhibits no mass. There is no tenderness. There is no rebound and no guarding.  Genitourinary: Vagina normal. Uterus is enlarged (approximately 10 weeks gravid). Uterus is not tender. Cervix exhibits discharge. Cervix exhibits no motion tenderness and no friability. Right adnexum displays no mass, no tenderness and no fullness. Left adnexum displays no mass, no tenderness and no fullness.  Musculoskeletal: Normal range of motion. She exhibits no edema and no tenderness.  Neurological: She is alert and oriented to person, place, and time. She has normal reflexes.  Skin: Skin is warm and dry. No rash noted. She is not diaphoretic. No erythema. No pallor.  Psychiatric: She has a normal mood and affect. Her behavior is normal. Judgment and thought content normal.    Maternal Exam:  Introitus: Normal vulva. Normal vagina.  Pelvis: adequate for delivery.         Assessment:    Pregnancy: [id number] Patient Active Problem List  Diagnosis  . Sickle-cell trait  . ANEMIA  . BARTHOLIN'S CYST ABSCESS  . AMENORRHEA  . ABNORMAL MATERNAL GLUCOSE TOLERANCE ANTEPARTUM  . Well woman exam  . ABNORMAL PAP SMEAR, LGSIL  . Dysuria  . Pregnant state, incidental       Plan:     Initial labs drawn. Prenatal vitamins. Problem list reviewed and updated. AFP3 discussed: declined. Role of ultrasound in pregnancy discussed; fetal survey: requested. Amniocentesis discussed: not indicated. Follow up in 2 weeks. 50% of 45 min visit spent on counseling and coordination of care.  F/u in 2 weeks following [place]   RIGBY, MICHAEL 12/29/2012

## 2012-12-30 LAB — CULTURE, OB URINE: Organism ID, Bacteria: NO GROWTH

## 2012-12-31 ENCOUNTER — Encounter: Payer: Self-pay | Admitting: *Deleted

## 2012-12-31 ENCOUNTER — Telehealth: Payer: Self-pay | Admitting: Sports Medicine

## 2012-12-31 ENCOUNTER — Encounter: Payer: Self-pay | Admitting: Sports Medicine

## 2012-12-31 MED ORDER — FLUCONAZOLE 150 MG PO TABS: 150.0000 mg | ORAL_TABLET | Freq: Once | ORAL | Status: DC

## 2012-12-31 NOTE — Telephone Encounter (Signed)
Dentist office called back wanting to know if  Patient could have xray, local with epinephrine , amoxicillin and what pain medication. Checked with Dr. Gwendolyn Grant and Dr. Deirdre Priest and they advised xray ok if shielded, amoxicillin ok and local with epinephrine ok.  Do not give NSAID  but may give tylenol or narcotic. I spoke with Dr. Laveda Norman, the dentist  and gave her this message .

## 2012-12-31 NOTE — Telephone Encounter (Addendum)
Paged Dr. Berline Chough.

## 2012-12-31 NOTE — Telephone Encounter (Signed)
Pt has appt for emergency dental pain - she needs a note stating that she is ok to be treated since she is pregnant - her appt is at 11:15 - needs asap pls fax to [place] - fax# [phone]

## 2012-12-31 NOTE — Telephone Encounter (Signed)
This encounter was created in error - please disregard.

## 2012-12-31 NOTE — Telephone Encounter (Signed)
Candida noted on PAP smear.  Will treat with Fluconazole X 2 over 3 days.  Pt will likely need recheck given will likely be on Amoxicillin for dental work performed today.  Prescription already sent in.

## 2012-12-31 NOTE — Telephone Encounter (Signed)
Dr. Berline Chough is on vacation. Will send to preceptor

## 2012-12-31 NOTE — Telephone Encounter (Signed)
Dr. Deirdre Priest wrote note stating patient "may see dentist and to call with any specific questions." patient notified to pick up note , unable to fax without ROI.

## 2012-12-31 NOTE — Assessment & Plan Note (Signed)
Discussed paternal testing but pt and FOB declined as stated have already done this with prior pregnancy.

## 2012-12-31 NOTE — Assessment & Plan Note (Signed)
[place] ordered today for dating as discrepancies with LMP QUAD screen following [place]

## 2013-01-01 NOTE — Telephone Encounter (Signed)
Called pt lmovm asking her to call back.Morgan Todd [place]

## 2013-01-04 ENCOUNTER — Other Ambulatory Visit: Payer: Self-pay | Admitting: Sports Medicine

## 2013-01-04 ENCOUNTER — Ambulatory Visit (HOSPITAL_COMMUNITY)
Admission: RE | Admit: 2013-01-04 | Discharge: 2013-01-04 | Disposition: A | Discharge status: Home / Self Care | Payer: 59 | Source: Ambulatory Visit | Attending: Family Medicine | Admitting: Family Medicine

## 2013-01-04 DIAGNOSIS — Z3689 Encounter for other specified antenatal screening: Secondary | ICD-10-CM | POA: Insufficient documentation

## 2013-01-04 DIAGNOSIS — Z3490 Encounter for supervision of normal pregnancy, unspecified, unspecified trimester: Secondary | ICD-10-CM

## 2013-01-04 NOTE — Telephone Encounter (Signed)
Called and spoke with pt she informed me that she picked up meds.Loralee Pacas [place]

## 2013-01-12 ENCOUNTER — Encounter: Payer: Self-pay | Admitting: Sports Medicine

## 2013-01-12 ENCOUNTER — Ambulatory Visit (INDEPENDENT_AMBULATORY_CARE_PROVIDER_SITE_OTHER): Payer: 59 | Admitting: Sports Medicine

## 2013-01-12 VITALS — BP 102/68 | Temp 99.1°F | Wt 113.0 lb

## 2013-01-12 DIAGNOSIS — Z3480 Encounter for supervision of other normal pregnancy, unspecified trimester: Secondary | ICD-10-CM

## 2013-01-12 DIAGNOSIS — O219 Vomiting of pregnancy, unspecified: Secondary | ICD-10-CM

## 2013-01-12 MED ORDER — B-6 50 MG PO TABS: 1.0000 | ORAL_TABLET | Freq: Three times a day (TID) | ORAL | Status: DC | PRN

## 2013-01-12 NOTE — Assessment & Plan Note (Signed)
QUAD today

## 2013-01-12 NOTE — Assessment & Plan Note (Signed)
B6 today

## 2013-01-12 NOTE — Patient Instructions (Addendum)
Morning Sickness Morning sickness is when you feel sick to your stomach (nauseous) during pregnancy. You may feel sick to your stomach and throw up (vomit). You may feel sick in the morning, but you can feel this way any time of day. Some women feel very sick to their stomach and cannot stop throwing up (hyperemesis gravidarum). HOME CARE  Take multivitamins as told by your doctor. Taking multivitamins before getting pregnant can stop or lessen the harshness of morning sickness.  Eat dry toast or unsalted crackers before getting out of bed.  Eat 5 to 6 small meals a day.  Eat dry and bland foods like rice and baked potatoes.  Do not drink liquids with meals. Drink between meals.  Do not eat greasy, fatty, or spicy foods.  Have someone cook for you if the smell of food causes you to feel sick or throw up.  Do not take vitamins with iron, or as told by your doctor.  Eat protein when you need a snack (nuts, yogurt, cheese).  Eat unsweetened gelatins for dessert.  Wear a bracelet used for sea sickness (acupressure wristband).  Go to a doctor that puts thin needles into certain body points (acupuncture) to improve how you feel.  Do not smoke.  Use a humidifier to keep the air in your house free of odors. GET HELP RIGHT AWAY IF:   You feel very sick to your stomach and cannot stop throwing up.  You pass out (faint).  You have a fever.  You need medicine to feel better.  You feel dizzy or lightheaded.  You are losing weight.  You need help knowing what to eat and what not to eat. MAKE SURE YOU:   Understand these instructions.  Will watch your condition.  Will get help right away if you are not doing well or get worse. Document Released: 01/16/2005 Document Revised: 03/02/2012 Document Reviewed: 03/08/2010 [facility] Patient Information 2013 [place], [place]. Pregnancy - Second Trimester The second trimester of pregnancy (3 to 6 months) is a period of rapid growth for  you and your baby. At the end of the sixth month, your baby is about 9 inches long and weighs 1 1/2 pounds. You will begin to feel the baby move between 18 and 20 weeks of the pregnancy. This is called quickening. Weight gain is faster. A clear fluid (colostrum) may leak out of your breasts. You may feel small contractions of the womb (uterus). This is known as false labor or Braxton-Hicks contractions. This is like a practice for labor when the baby is ready to be born. Usually, the problems with morning sickness have usually passed by the end of your first trimester. Some women develop small dark blotches (called cholasma, mask of pregnancy) on their face that usually goes away after the baby is born. Exposure to the sun makes the blotches worse. Acne may also develop in some pregnant women and pregnant women who have acne, may find that it goes away. PRENATAL EXAMS  Blood work may continue to be done during prenatal exams. These tests are done to check on your health and the probable health of your baby. Blood work is used to follow your blood levels (hemoglobin). Anemia (low hemoglobin) is common during pregnancy. Iron and vitamins are given to help prevent this. You will also be checked for diabetes between 24 and 28 weeks of the pregnancy. Some of the previous blood tests may be repeated.  The size of the uterus is measured during each visit.  This is to make sure that the baby is continuing to grow properly according to the dates of the pregnancy.  Your blood pressure is checked every prenatal visit. This is to make sure you are not getting toxemia.  Your urine is checked to make sure you do not have an infection, diabetes or protein in the urine.  Your weight is checked often to make sure gains are happening at the suggested rate. This is to ensure that both you and your baby are growing normally.  Sometimes, an ultrasound is performed to confirm the proper growth and development of the baby.  This is a test which bounces harmless sound waves off the baby so your caregiver can more accurately determine due dates. Sometimes, a specialized test is done on the amniotic fluid surrounding the baby. This test is called an amniocentesis. The amniotic fluid is obtained by sticking a needle into the belly (abdomen). This is done to check the chromosomes in instances where there is a concern about possible genetic problems with the baby. It is also sometimes done near the end of pregnancy if an early delivery is required. In this case, it is done to help make sure the baby's lungs are mature enough for the baby to live outside of the womb. CHANGES OCCURING IN THE SECOND TRIMESTER OF PREGNANCY Your body goes through many changes during pregnancy. They vary from person to person. Talk to your caregiver about changes you notice that you are concerned about.  During the second trimester, you will likely have an increase in your appetite. It is normal to have cravings for certain foods. This varies from person to person and pregnancy to pregnancy.  Your lower abdomen will begin to bulge.  You may have to urinate more often because the uterus and baby are pressing on your bladder. It is also common to get more bladder infections during pregnancy (pain with urination). You can help this by drinking lots of fluids and emptying your bladder before and after intercourse.  You may begin to get stretch marks on your hips, abdomen, and breasts. These are normal changes in the body during pregnancy. There are no exercises or medications to take that prevent this change.  You may begin to develop swollen and bulging veins (varicose veins) in your legs. Wearing support hose, elevating your feet for 15 minutes, 3 to 4 times a day and limiting salt in your diet helps lessen the problem.  Heartburn may develop as the uterus grows and pushes up against the stomach. Antacids recommended by your caregiver helps with this  problem. Also, eating smaller meals 4 to 5 times a day helps.  Constipation can be treated with a stool softener or adding bulk to your diet. Drinking lots of fluids, vegetables, fruits, and whole grains are helpful.  Exercising is also helpful. If you have been very active up until your pregnancy, most of these activities can be continued during your pregnancy. If you have been less active, it is helpful to start an exercise program such as walking.  Hemorrhoids (varicose veins in the rectum) may develop at the end of the second trimester. Warm sitz baths and hemorrhoid cream recommended by your caregiver helps hemorrhoid problems.  Backaches may develop during this time of your pregnancy. Avoid heavy lifting, wear low heal shoes and practice good posture to help with backache problems.  Some pregnant women develop tingling and numbness of their hand and fingers because of swelling and tightening of ligaments in the  wrist (carpel tunnel syndrome). This goes away after the baby is born.  As your breasts enlarge, you may have to get a bigger bra. Get a comfortable, cotton, support bra. Do not get a nursing bra until the last month of the pregnancy if you will be nursing the baby.  You may get a dark line from your belly button to the pubic area called the linea nigra.  You may develop rosy cheeks because of increase blood flow to the face.  You may develop spider looking lines of the face, neck, arms and chest. These go away after the baby is born. HOME CARE INSTRUCTIONS   It is extremely important to avoid all smoking, herbs, alcohol, and unprescribed drugs during your pregnancy. These chemicals affect the formation and growth of the baby. Avoid these chemicals throughout the pregnancy to ensure the delivery of a healthy infant.  Most of your home care instructions are the same as suggested for the first trimester of your pregnancy. Keep your caregiver's appointments. Follow your caregiver's  instructions regarding medication use, exercise and diet.  During pregnancy, you are providing food for you and your baby. Continue to eat regular, well-balanced meals. Choose foods such as meat, fish, milk and other low fat dairy products, vegetables, fruits, and whole-grain breads and cereals. Your caregiver will tell you of the ideal weight gain.  A physical sexual relationship may be continued up until near the end of pregnancy if there are no other problems. Problems could include early (premature) leaking of amniotic fluid from the membranes, vaginal bleeding, abdominal pain, or other medical or pregnancy problems.  Exercise regularly if there are no restrictions. Check with your caregiver if you are unsure of the safety of some of your exercises. The greatest weight gain will occur in the last 2 trimesters of pregnancy. Exercise will help you:  Control your weight.  Get you in shape for labor and delivery.  Lose weight after you have the baby.  Wear a good support or jogging bra for breast tenderness during pregnancy. This may help if worn during sleep. Pads or tissues may be used in the bra if you are leaking colostrum.  Do not use hot tubs, steam rooms or saunas throughout the pregnancy.  Wear your seat belt at all times when driving. This protects you and your baby if you are in an accident.  Avoid raw meat, uncooked cheese, cat litter boxes and soil used by cats. These carry germs that can cause birth defects in the baby.  The second trimester is also a good time to visit your dentist for your dental health if this has not been done yet. Getting your teeth cleaned is OK. Use a soft toothbrush. Brush gently during pregnancy.  It is easier to loose urine during pregnancy. Tightening up and strengthening the pelvic muscles will help with this problem. Practice stopping your urination while you are going to the bathroom. These are the same muscles you need to strengthen. It is also the  muscles you would use as if you were trying to stop from passing gas. You can practice tightening these muscles up 10 times a set and repeating this about 3 times per day. Once you know what muscles to tighten up, do not perform these exercises during urination. It is more likely to contribute to an infection by backing up the urine.  Ask for help if you have financial, counseling or nutritional needs during pregnancy. Your caregiver will be able to offer counseling  for these needs as well as refer you for other special needs.  Your skin may become oily. If so, wash your face with mild soap, use non-greasy moisturizer and oil or cream based makeup. MEDICATIONS AND DRUG USE IN PREGNANCY  Take prenatal vitamins as directed. The vitamin should contain 1 milligram of folic acid. Keep all vitamins out of reach of children. Only a couple vitamins or tablets containing iron may be fatal to a baby or young child when ingested.  Avoid use of all medications, including herbs, over-the-counter medications, not prescribed or suggested by your caregiver. Only take over-the-counter or prescription medicines for pain, discomfort, or fever as directed by your caregiver. Do not use aspirin.  Let your caregiver also know about herbs you may be using.  Alcohol is related to a number of birth defects. This includes fetal alcohol syndrome. All alcohol, in any form, should be avoided completely. Smoking will cause low birth rate and premature babies.  Street or illegal drugs are very harmful to the baby. They are absolutely forbidden. A baby born to an addicted mother will be addicted at birth. The baby will go through the same withdrawal an adult does. SEEK MEDICAL CARE IF:  You have any concerns or worries during your pregnancy. It is better to call with your questions if you feel they cannot wait, rather than worry about them. SEEK IMMEDIATE MEDICAL CARE IF:   An unexplained oral temperature above 102 F (38.9 C)  develops, or as your caregiver suggests.  You have leaking of fluid from the vagina (birth canal). If leaking membranes are suspected, take your temperature and tell your caregiver of this when you call.  There is vaginal spotting, bleeding, or passing clots. Tell your caregiver of the amount and how many pads are used. Light spotting in pregnancy is common, especially following intercourse.  You develop a bad smelling vaginal discharge with a change in the color from clear to white.  You continue to feel sick to your stomach (nauseated) and have no relief from remedies suggested. You vomit blood or coffee ground-like materials.  You lose more than 2 pounds of weight or gain more than 2 pounds of weight over 1 week, or as suggested by your caregiver.  You notice swelling of your face, hands, feet, or legs.  You get exposed to [place] measles and have never had them.  You are exposed to fifth disease or chickenpox.  You develop belly (abdominal) pain. Round ligament discomfort is a common non-cancerous (benign) cause of abdominal pain in pregnancy. Your caregiver still must evaluate you.  You develop a bad headache that does not go away.  You develop fever, diarrhea, pain with urination, or shortness of breath.  You develop visual problems, blurry, or double vision.  You fall or are in a car accident or any kind of trauma.  There is mental or physical violence at home. Document Released: 12/03/2001 Document Revised: 03/02/2012 Document Reviewed: 06/07/2009 [facility] Patient Information 2013 [place], [place].

## 2013-01-12 NOTE — Progress Notes (Signed)
[redacted]w[redacted]d by [place] (dating updated today) [id number] here for routine prenatal - taking prenatal [place] confirmation of movement and Cardiac activity >120bpm Some Nausea and bloating, no vomiting - Rx for B6 > QUAD screen NEXT VISIT > F/u 4 weeks

## 2013-01-18 NOTE — Progress Notes (Signed)
Note reviewed.  Agree with Dr. Janeece Riggers plan and note. Issues noted: Sickle cell trait - should be offered genetic counseling, paternal testing. H/o section - will need delivery planning at 30 weeks with OB faculty. Dating - will use early sono for dating.

## 2013-01-20 ENCOUNTER — Telehealth: Payer: Self-pay | Admitting: Sports Medicine

## 2013-01-20 NOTE — Telephone Encounter (Signed)
Pt wants to speak with nurse about what to do

## 2013-01-20 NOTE — Telephone Encounter (Signed)
Patient is calling because the yeast infection she was dx'd with earlier this month is continuing and she was hoping for more medicine to be sent to her pharmacy.

## 2013-01-20 NOTE — Telephone Encounter (Signed)
Patient did use diflucan and 4 days later took second tab. Continues with white discharge and much itching. Will send message to Dr. Berline Chough.

## 2013-01-21 ENCOUNTER — Ambulatory Visit (INDEPENDENT_AMBULATORY_CARE_PROVIDER_SITE_OTHER): Payer: 59 | Admitting: Sports Medicine

## 2013-01-21 VITALS — BP 102/64 | Temp 98.6°F | Wt 114.3 lb

## 2013-01-21 DIAGNOSIS — N760 Acute vaginitis: Secondary | ICD-10-CM

## 2013-01-21 DIAGNOSIS — B9689 Other specified bacterial agents as the cause of diseases classified elsewhere: Secondary | ICD-10-CM

## 2013-01-21 DIAGNOSIS — B373 Candidiasis of vulva and vagina: Secondary | ICD-10-CM

## 2013-01-21 DIAGNOSIS — A499 Bacterial infection, unspecified: Secondary | ICD-10-CM

## 2013-01-21 DIAGNOSIS — R300 Dysuria: Secondary | ICD-10-CM

## 2013-01-21 LAB — POCT URINALYSIS DIPSTICK
Bilirubin, UA: NEGATIVE
Blood, UA: NEGATIVE
Glucose, UA: NEGATIVE
Nitrite, UA: NEGATIVE
Urobilinogen, UA: 0.2
pH, UA: 6.5

## 2013-01-21 LAB — POCT UA - MICROSCOPIC ONLY

## 2013-01-21 LAB — POCT WET PREP (WET MOUNT): Trichomonas Wet Prep HPF POC: NEGATIVE

## 2013-01-21 MED ORDER — METRONIDAZOLE 500 MG PO TABS: 500.0000 mg | ORAL_TABLET | Freq: Two times a day (BID) | ORAL | Status: DC

## 2013-01-21 MED ORDER — FLUCONAZOLE 150 MG PO TABS: 150.0000 mg | ORAL_TABLET | Freq: Once | ORAL | Status: DC

## 2013-01-21 NOTE — Assessment & Plan Note (Signed)
Fluconazole X2 again probiotic

## 2013-01-21 NOTE — Telephone Encounter (Signed)
Will need to schedule an appointment. Needs wet prep to eval for Trich Please call and have her schedule appointment

## 2013-01-21 NOTE — Assessment & Plan Note (Signed)
Both clue cells and yeast on wet prep Flagyl po bid X 7

## 2013-01-21 NOTE — Progress Notes (Signed)
[redacted]w[redacted]d by [place] [id number] - day of appointment for discharge Still having white thick discharge; Has been on ABX for tooth procedure, improved with diflucan but still took ABX afterwards Wet Prep today showed yeast and clue cells Tx for both today; encouraged probiotics Return for 16 week visit

## 2013-01-21 NOTE — Telephone Encounter (Signed)
Patient notified of message today. Appointment scheduled today.

## 2013-02-11 ENCOUNTER — Encounter: Payer: 59 | Admitting: Sports Medicine

## 2013-02-22 ENCOUNTER — Encounter: Payer: 59 | Admitting: Sports Medicine

## 2013-02-23 ENCOUNTER — Encounter (HOSPITAL_COMMUNITY): Payer: Self-pay | Admitting: *Deleted

## 2013-02-23 ENCOUNTER — Inpatient Hospital Stay (HOSPITAL_COMMUNITY)
Admission: AD | Admit: 2013-02-23 | Discharge: 2013-02-23 | Disposition: A | Discharge status: Home / Self Care | Payer: 59 | Source: Ambulatory Visit | Attending: Obstetrics & Gynecology | Admitting: Obstetrics & Gynecology

## 2013-02-23 DIAGNOSIS — K5900 Constipation, unspecified: Secondary | ICD-10-CM | POA: Insufficient documentation

## 2013-02-23 DIAGNOSIS — R109 Unspecified abdominal pain: Secondary | ICD-10-CM | POA: Insufficient documentation

## 2013-02-23 DIAGNOSIS — O99891 Other specified diseases and conditions complicating pregnancy: Secondary | ICD-10-CM | POA: Insufficient documentation

## 2013-02-23 HISTORY — DX: Reserved for concepts with insufficient information to code with codable children: IMO0002

## 2013-02-23 HISTORY — DX: Unspecified abnormal cytological findings in specimens from cervix uteri: R87.619

## 2013-02-23 LAB — CBC
HCT: 28.9 % — ABNORMAL LOW (ref 36.0–46.0)
MCH: 29.9 pg (ref 26.0–34.0)
MCV: 84.8 fL (ref 78.0–100.0)
RBC: 3.41 MIL/uL — ABNORMAL LOW (ref 3.87–5.11)
WBC: 6.1 10*3/uL (ref 4.0–10.5)

## 2013-02-23 MED ORDER — FLEET ENEMA 7-19 GM/118ML RE ENEM
1.0000 | ENEMA | Freq: Once | RECTAL | Status: AC
  Administered 2013-02-23: 1 via RECTAL

## 2013-02-23 MED ORDER — DOCUSATE SODIUM 250 MG PO CAPS: 250.0000 mg | ORAL_CAPSULE | Freq: Two times a day (BID) | ORAL | Status: DC

## 2013-02-23 MED ORDER — POLYETHYLENE GLYCOL 3350 17 GM/SCOOP PO POWD: 17.0000 g | Freq: Every day | ORAL | Status: DC

## 2013-02-23 NOTE — MAU Provider Note (Signed)
  History     CSN: [id number]  Arrival date and time: 02/23/13 1009   First Darrin Koman Initiated Contact with Patient 02/23/13 1057      Chief Complaint  Patient presents with  . Abdominal Pain  . Constipation   HPI Pt is [id number] who is 18weeks being seen in [facility] clinic.  Pt presents with abdominal pain and constipation.  Pt was seen on 1/30in the clinic and treated for BV   Past Medical History  Diagnosis Date  . 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 Abnormal Pap smear     Past Surgical History  Procedure Laterality Date  . Cesarean section  10/30/2009    Failure to Progress  . Tooth extraction      Family History  Problem Relation Age of Onset  . Sickle cell anemia Mother   . Multiple sclerosis Father   . Sickle cell anemia Maternal Grandfather   . Cervical cancer Maternal Grandmother   . Schizophrenia Maternal Uncle     History  Substance Use Topics  . Smoking status: Former Smoker -- 0.10 packs/day  . Smokeless tobacco: Not on file  . Alcohol Use: No    Allergies: No Known Allergies  Prescriptions prior to admission  Medication Sig Dispense Refill  . acidophilus (RISAQUAD) CAPS Take 1 capsule by mouth daily.      . Prenatal Vit-Fe Fumarate-FA (PRENATAL MULTIVITAMIN) TABS Take 1 tablet by mouth daily at 12 noon.        Review of Systems  Constitutional: Negative for fever and chills.  Gastrointestinal: Positive for nausea, vomiting, abdominal pain and constipation. Negative for diarrhea.  Neurological: Negative for headaches.   Physical Exam   Blood pressure 106/56, pulse 76, temperature 98.2 F (36.8 C), temperature source Oral, resp. rate 18, height 4' 11.25" (1.505 m), weight 118 lb (53.524 kg), last menstrual period 09/25/2012.  Physical Exam  Vitals reviewed. Constitutional: She is oriented to person, place, and time. She appears well-developed and well-nourished. No distress.  HENT:  Head:  Normocephalic.  Eyes: Pupils are equal, round, and reactive to light.  Neck: Normal range of motion. Neck supple.  Cardiovascular: Normal rate.   Respiratory: Effort normal.  GI: Soft. Bowel sounds are normal. She exhibits no distension. There is no tenderness. There is no rebound and no guarding.  Musculoskeletal: Normal range of motion.  Neurological: She is alert and oriented to person, place, and time.  Skin: Skin is warm and dry.  Psychiatric: She has a normal mood and affect.    MAU Course  Procedures CBC Fleets enema- with results- not complete, but pt got improvement in pain  Assessment and Plan  Constipation in pregnancy Recommend Miralax and Metamucil and colace Information on constipation given to pt F/u with scheduled OB appointment- f/u sooner if increase in symptoms  LINEBERRY,SUSAN 02/23/2013, 10:58 AM

## 2013-02-23 NOTE — MAU Note (Signed)
Name and DOB verified, pt confirmed spelling on armband is correct.

## 2013-02-23 NOTE — MAU Note (Addendum)
abd started really hurting this morning.  Has been real constipated, hasn't gone in a few days, and when she has gone it has been very small.  Started taking benefiber, not really helping.

## 2013-03-01 ENCOUNTER — Ambulatory Visit (INDEPENDENT_AMBULATORY_CARE_PROVIDER_SITE_OTHER): Payer: 59 | Admitting: Sports Medicine

## 2013-03-01 VITALS — BP 105/64 | Temp 97.6°F | Wt 117.7 lb

## 2013-03-01 DIAGNOSIS — Z3480 Encounter for supervision of other normal pregnancy, unspecified trimester: Secondary | ICD-10-CM

## 2013-03-01 DIAGNOSIS — Z3482 Encounter for supervision of other normal pregnancy, second trimester: Secondary | ICD-10-CM

## 2013-03-01 NOTE — Patient Instructions (Addendum)
Pregnancy - Second Trimester The second trimester of pregnancy (3 to 6 months) is a period of rapid growth for you and your baby. At the end of the sixth month, your baby is about 9 inches long and weighs 1 1/2 pounds. You will begin to feel the baby move between 18 and 20 weeks of the pregnancy. This is called quickening. Weight gain is faster. A clear fluid (colostrum) may leak out of your breasts. You may feel small contractions of the womb (uterus). This is known as false labor or Braxton-Hicks contractions. This is like a practice for labor when the baby is ready to be born. Usually, the problems with morning sickness have usually passed by the end of your first trimester. Some women develop small dark blotches (called cholasma, mask of pregnancy) on their face that usually goes away after the baby is born. Exposure to the sun makes the blotches worse. Acne may also develop in some pregnant women and pregnant women who have acne, may find that it goes away. PRENATAL EXAMS  Blood work may continue to be done during prenatal exams. These tests are done to check on your health and the probable health of your baby. Blood work is used to follow your blood levels (hemoglobin). Anemia (low hemoglobin) is common during pregnancy. Iron and vitamins are given to help prevent this. You will also be checked for diabetes between 24 and 28 weeks of the pregnancy. Some of the previous blood tests may be repeated.  The size of the uterus is measured during each visit. This is to make sure that the baby is continuing to grow properly according to the dates of the pregnancy.  Your blood pressure is checked every prenatal visit. This is to make sure you are not getting toxemia.  Your urine is checked to make sure you do not have an infection, diabetes or protein in the urine.  Your weight is checked often to make sure gains are happening at the suggested rate. This is to ensure that both you and your baby are growing  normally.  Sometimes, an ultrasound is performed to confirm the proper growth and development of the baby. This is a test which bounces harmless sound waves off the baby so your caregiver can more accurately determine due dates. Sometimes, a specialized test is done on the amniotic fluid surrounding the baby. This test is called an amniocentesis. The amniotic fluid is obtained by sticking a needle into the belly (abdomen). This is done to check the chromosomes in instances where there is a concern about possible genetic problems with the baby. It is also sometimes done near the end of pregnancy if an early delivery is required. In this case, it is done to help make sure the baby's lungs are mature enough for the baby to live outside of the womb. CHANGES OCCURING IN THE SECOND TRIMESTER OF PREGNANCY Your body goes through many changes during pregnancy. They vary from person to person. Talk to your caregiver about changes you notice that you are concerned about.  During the second trimester, you will likely have an increase in your appetite. It is normal to have cravings for certain foods. This varies from person to person and pregnancy to pregnancy.  Your lower abdomen will begin to bulge.  You may have to urinate more often because the uterus and baby are pressing on your bladder. It is also common to get more bladder infections during pregnancy (pain with urination). You can help this by   drinking lots of fluids and emptying your bladder before and after intercourse.  You may begin to get stretch marks on your hips, abdomen, and breasts. These are normal changes in the body during pregnancy. There are no exercises or medications to take that prevent this change.  You may begin to develop swollen and bulging veins (varicose veins) in your legs. Wearing support hose, elevating your feet for 15 minutes, 3 to 4 times a day and limiting salt in your diet helps lessen the problem.  Heartburn may develop  as the uterus grows and pushes up against the stomach. Antacids recommended by your caregiver helps with this problem. Also, eating smaller meals 4 to 5 times a day helps.  Constipation can be treated with a stool softener or adding bulk to your diet. Drinking lots of fluids, vegetables, fruits, and whole grains are helpful.  Exercising is also helpful. If you have been very active up until your pregnancy, most of these activities can be continued during your pregnancy. If you have been less active, it is helpful to start an exercise program such as walking.  Hemorrhoids (varicose veins in the rectum) may develop at the end of the second trimester. Warm sitz baths and hemorrhoid cream recommended by your caregiver helps hemorrhoid problems.  Backaches may develop during this time of your pregnancy. Avoid heavy lifting, wear low heal shoes and practice good posture to help with backache problems.  Some pregnant women develop tingling and numbness of their hand and fingers because of swelling and tightening of ligaments in the wrist (carpel tunnel syndrome). This goes away after the baby is born.  As your breasts enlarge, you may have to get a bigger bra. Get a comfortable, cotton, support bra. Do not get a nursing bra until the last month of the pregnancy if you will be nursing the baby.  You may get a dark line from your belly button to the pubic area called the linea nigra.  You may develop rosy cheeks because of increase blood flow to the face.  You may develop spider looking lines of the face, neck, arms and chest. These go away after the baby is born. HOME CARE INSTRUCTIONS   It is extremely important to avoid all smoking, herbs, alcohol, and unprescribed drugs during your pregnancy. These chemicals affect the formation and growth of the baby. Avoid these chemicals throughout the pregnancy to ensure the delivery of a healthy infant.  Most of your home care instructions are the same as  suggested for the first trimester of your pregnancy. Keep your caregiver's appointments. Follow your caregiver's instructions regarding medication use, exercise and diet.  During pregnancy, you are providing food for you and your baby. Continue to eat regular, well-balanced meals. Choose foods such as meat, fish, milk and other low fat dairy products, vegetables, fruits, and whole-grain breads and cereals. Your caregiver will tell you of the ideal weight gain.  A physical sexual relationship may be continued up until near the end of pregnancy if there are no other problems. Problems could include early (premature) leaking of amniotic fluid from the membranes, vaginal bleeding, abdominal pain, or other medical or pregnancy problems.  Exercise regularly if there are no restrictions. Check with your caregiver if you are unsure of the safety of some of your exercises. The greatest weight gain will occur in the last 2 trimesters of pregnancy. Exercise will help you:  Control your weight.  Get you in shape for labor and delivery.  Lose weight   after you have the baby.  Wear a good support or jogging bra for breast tenderness during pregnancy. This may help if worn during sleep. Pads or tissues may be used in the bra if you are leaking colostrum.  Do not use hot tubs, steam rooms or saunas throughout the pregnancy.  Wear your seat belt at all times when driving. This protects you and your baby if you are in an accident.  Avoid raw meat, uncooked cheese, cat litter boxes and soil used by cats. These carry germs that can cause birth defects in the baby.  The second trimester is also a good time to visit your dentist for your dental health if this has not been done yet. Getting your teeth cleaned is OK. Use a soft toothbrush. Brush gently during pregnancy.  It is easier to loose urine during pregnancy. Tightening up and strengthening the pelvic muscles will help with this problem. Practice stopping your  urination while you are going to the bathroom. These are the same muscles you need to strengthen. It is also the muscles you would use as if you were trying to stop from passing gas. You can practice tightening these muscles up 10 times a set and repeating this about 3 times per day. Once you know what muscles to tighten up, do not perform these exercises during urination. It is more likely to contribute to an infection by backing up the urine.  Ask for help if you have financial, counseling or nutritional needs during pregnancy. Your caregiver will be able to offer counseling for these needs as well as refer you for other special needs.  Your skin may become oily. If so, wash your face with mild soap, use non-greasy moisturizer and oil or cream based makeup. MEDICATIONS AND DRUG USE IN PREGNANCY  Take prenatal vitamins as directed. The vitamin should contain 1 milligram of folic acid. Keep all vitamins out of reach of children. Only a couple vitamins or tablets containing iron may be fatal to a baby or young child when ingested.  Avoid use of all medications, including herbs, over-the-counter medications, not prescribed or suggested by your caregiver. Only take over-the-counter or prescription medicines for pain, discomfort, or fever as directed by your caregiver. Do not use aspirin.  Let your caregiver also know about herbs you may be using.  Alcohol is related to a number of birth defects. This includes fetal alcohol syndrome. All alcohol, in any form, should be avoided completely. Smoking will cause low birth rate and premature babies.  Street or illegal drugs are very harmful to the baby. They are absolutely forbidden. A baby born to an addicted mother will be addicted at birth. The baby will go through the same withdrawal an adult does. SEEK MEDICAL CARE IF:  You have any concerns or worries during your pregnancy. It is better to call with your questions if you feel they cannot wait, rather  than worry about them. SEEK IMMEDIATE MEDICAL CARE IF:   An unexplained oral temperature above 102 F (38.9 C) develops, or as your caregiver suggests.  You have leaking of fluid from the vagina (birth canal). If leaking membranes are suspected, take your temperature and tell your caregiver of this when you call.  There is vaginal spotting, bleeding, or passing clots. Tell your caregiver of the amount and how many pads are used. Light spotting in pregnancy is common, especially following intercourse.  You develop a bad smelling vaginal discharge with a change in the color from clear   to white.  You continue to feel sick to your stomach (nauseated) and have no relief from remedies suggested. You vomit blood or coffee ground-like materials.  You lose more than 2 pounds of weight or gain more than 2 pounds of weight over 1 week, or as suggested by your caregiver.  You notice swelling of your face, hands, feet, or legs.  You get exposed to German measles and have never had them.  You are exposed to fifth disease or chickenpox.  You develop belly (abdominal) pain. Round ligament discomfort is a common non-cancerous (benign) cause of abdominal pain in pregnancy. Your caregiver still must evaluate you.  You develop a bad headache that does not go away.  You develop fever, diarrhea, pain with urination, or shortness of breath.  You develop visual problems, blurry, or double vision.  You fall or are in a car accident or any kind of trauma.  There is mental or physical violence at home. Document Released: 12/03/2001 Document Revised: 03/02/2012 Document Reviewed: 06/07/2009 ExitCare Patient Information 2013 [employer].  

## 2013-03-01 NOTE — Assessment & Plan Note (Signed)
Overview updated 

## 2013-03-01 NOTE — Progress Notes (Signed)
[redacted]w[redacted]d by [place] [id number] for routine care. Still having infrequent BM after MAU visit.  Titrate Miralax to 2BM /day.  Increase to 2 capfuls with instructions to go up to 4 if needed No new complaints. QUAD screen today. Anatomy US ordered F/u in 4 weeks.  Will need OB clinic appointment

## 2013-03-09 ENCOUNTER — Ambulatory Visit (HOSPITAL_COMMUNITY)
Admission: RE | Admit: 2013-03-09 | Discharge: 2013-03-09 | Disposition: A | Discharge status: Home / Self Care | Payer: 59 | Source: Ambulatory Visit | Attending: Family Medicine | Admitting: Family Medicine

## 2013-03-09 DIAGNOSIS — Z1389 Encounter for screening for other disorder: Secondary | ICD-10-CM | POA: Insufficient documentation

## 2013-03-09 DIAGNOSIS — Z3482 Encounter for supervision of other normal pregnancy, second trimester: Secondary | ICD-10-CM

## 2013-03-09 DIAGNOSIS — Z363 Encounter for antenatal screening for malformations: Secondary | ICD-10-CM | POA: Insufficient documentation

## 2013-03-09 DIAGNOSIS — O358XX0 Maternal care for other (suspected) fetal abnormality and damage, not applicable or unspecified: Secondary | ICD-10-CM | POA: Insufficient documentation

## 2013-04-02 ENCOUNTER — Ambulatory Visit (INDEPENDENT_AMBULATORY_CARE_PROVIDER_SITE_OTHER): Payer: 59 | Admitting: Sports Medicine

## 2013-04-02 VITALS — BP 101/53 | Temp 98.2°F | Wt 123.0 lb

## 2013-04-02 DIAGNOSIS — Z3482 Encounter for supervision of other normal pregnancy, second trimester: Secondary | ICD-10-CM

## 2013-04-02 DIAGNOSIS — Z3480 Encounter for supervision of other normal pregnancy, unspecified trimester: Secondary | ICD-10-CM

## 2013-04-02 MED ORDER — COMPLETE PRENATAL/DHA 30-0.975 & 300 MG PO MISC: 1.0000 | Freq: Every day | ORAL | Status: DC

## 2013-04-02 NOTE — Progress Notes (Signed)
[redacted]w[redacted]d by ultrasound [id number] for routine care. Reports feeling good and is doing well overall.  No specific concerns.  Taking prenatal vitamin daily.  Does wish to have refill on DHA containing prenatal, Rx sent in..  Continue MiraLax with some over titration.  No new complaints. Quads screening was negative. anatomy ultrasound overall normal lobar 2 bands noted, no soft tissue involvement.  No indication for repeat.  Monitor for delayed growth.  Followup in 4 weeks at [facility] clinic.  Needs one hour Glucola next visit, discussed today.   Updated pregnancy overview

## 2013-04-02 NOTE — Patient Instructions (Addendum)
It was good to see you. Please make a follow up appointment in 4 weeks with Dr. Mauricio Po in [facility]  Etonogestrel implant What is this medicine? ETONOGESTREL is a contraceptive (birth control) device. It is used to prevent pregnancy. It can be used for up to 3 years. This medicine may be used for other purposes; ask your health care provider or pharmacist if you have questions. What should I tell my health care provider before I take this medicine? They need to know if you have any of these conditions: -abnormal vaginal bleeding -blood vessel disease or blood clots -cancer of the breast, cervix, or liver -depression -diabetes -gallbladder disease -headaches -heart disease or recent heart attack -high blood pressure -high cholesterol -kidney disease -liver disease -renal disease -seizures -tobacco smoker -an unusual or allergic reaction to etonogestrel, other hormones, anesthetics or antiseptics, medicines, foods, dyes, or preservatives -pregnant or trying to get pregnant -breast-feeding How should I use this medicine? This device is inserted just under the skin on the inner side of your upper arm by a health care professional. Talk to your pediatrician regarding the use of this medicine in children. Special care may be needed. Overdosage: If you think you've taken too much of this medicine contact a poison control center or emergency room at once. Overdosage: If you think you have taken too much of this medicine contact a poison control center or emergency room at once. NOTE: This medicine is only for you. Do not share this medicine with others. What if I miss a dose? This does not apply. What may interact with this medicine? Do not take this medicine with any of the following medications: -amprenavir -bosentan -fosamprenavir This medicine may also interact with the following medications: -barbiturate medicines for inducing sleep or treating seizures -certain medicines for  fungal infections like ketoconazole and itraconazole -griseofulvin -medicines to treat seizures like carbamazepine, felbamate, oxcarbazepine, phenytoin, topiramate -modafinil -phenylbutazone -rifampin -some medicines to treat HIV infection like atazanavir, indinavir, lopinavir, nelfinavir, tipranavir, ritonavir -St. John's wort This list may not describe all possible interactions. Give your health care provider a list of all the medicines, herbs, non-prescription drugs, or dietary supplements you use. Also tell them if you smoke, drink alcohol, or use illegal drugs. Some items may interact with your medicine. What should I watch for while using this medicine? This product does not protect you against HIV infection (AIDS) or other sexually transmitted diseases. You should be able to feel the implant by pressing your fingertips over the skin where it was inserted. Tell your doctor if you cannot feel the implant. What side effects may I notice from receiving this medicine? Side effects that you should report to your doctor or health care professional as soon as possible: -allergic reactions like skin rash, itching or hives, swelling of the face, lips, or tongue -breast lumps -changes in vision -confusion, trouble speaking or understanding -dark urine -depressed mood -general ill feeling or flu-like symptoms -light-colored stools -loss of appetite, nausea -right upper belly pain -severe headaches -severe pain, swelling, or tenderness in the abdomen -shortness of breath, chest pain, swelling in a leg -signs of pregnancy -sudden numbness or weakness of the face, arm or leg -trouble walking, dizziness, loss of balance or coordination -unusual vaginal bleeding, discharge -unusually weak or tired -yellowing of the eyes or skin Side effects that usually do not require medical attention (Report these to your doctor or health care professional if they continue or are  bothersome.): -acne -breast pain -changes in  weight -cough -fever or chills -headache -irregular menstrual bleeding -itching, burning, and vaginal discharge -pain or difficulty passing urine -sore throat This list may not describe all possible side effects. Call your doctor for medical advice about side effects. You may report side effects to FDA at 1-800-FDA-1088. Where should I keep my medicine? This drug is given in a hospital or clinic and will not be stored at home. NOTE: This sheet is a summary. It may not cover all possible information. If you have questions about this medicine, talk to your doctor, pharmacist, or health care provider.  2013, [employer]vier/[employer] Standard. (09/01/2009 3:54:17 PM)

## 2013-04-19 ENCOUNTER — Encounter (HOSPITAL_COMMUNITY): Payer: Self-pay | Admitting: *Deleted

## 2013-04-19 ENCOUNTER — Inpatient Hospital Stay (HOSPITAL_COMMUNITY)
Admission: AD | Admit: 2013-04-19 | Discharge: 2013-04-19 | Disposition: A | Discharge status: Home / Self Care | Payer: 59 | Source: Ambulatory Visit | Attending: Obstetrics and Gynecology | Admitting: Obstetrics and Gynecology

## 2013-04-19 DIAGNOSIS — M545 Low back pain, unspecified: Secondary | ICD-10-CM | POA: Insufficient documentation

## 2013-04-19 DIAGNOSIS — O4700 False labor before 37 completed weeks of gestation, unspecified trimester: Secondary | ICD-10-CM | POA: Insufficient documentation

## 2013-04-19 DIAGNOSIS — N949 Unspecified condition associated with female genital organs and menstrual cycle: Secondary | ICD-10-CM | POA: Insufficient documentation

## 2013-04-19 DIAGNOSIS — N898 Other specified noninflammatory disorders of vagina: Secondary | ICD-10-CM

## 2013-04-19 DIAGNOSIS — O26899 Other specified pregnancy related conditions, unspecified trimester: Secondary | ICD-10-CM

## 2013-04-19 DIAGNOSIS — M549 Dorsalgia, unspecified: Secondary | ICD-10-CM

## 2013-04-19 LAB — WET PREP, GENITAL
Clue Cells Wet Prep HPF POC: NONE SEEN
Trich, Wet Prep: NONE SEEN

## 2013-04-19 LAB — OB RESULTS CONSOLE GC/CHLAMYDIA: Chlamydia: NEGATIVE

## 2013-04-19 MED ORDER — CYCLOBENZAPRINE HCL 10 MG PO TABS: 10.0000 mg | ORAL_TABLET | Freq: Three times a day (TID) | ORAL | Status: DC | PRN

## 2013-04-19 NOTE — MAU Note (Addendum)
Patient c/o of leaking beginning at around 4:50pm today; "felt like peed on self". Patients states clear fluid.  Last intercourse was this am. Also c/o of lower back pain that has been going on for the last week. Denies bleeding. States is having braxton hicks contractions.

## 2013-04-19 NOTE — MAU Provider Note (Signed)
History     CSN: [id number]  Arrival date and time: 04/19/13 1747   First Provider Initiated Contact with Patient 04/19/13 1822      No chief complaint on file.  HPI 27 y.o. [id number] at [redacted]w[redacted]d with possible leak of fluid a few hours ago. Felt trickle and had damp spot on underwear, no blood. Only happened once. Has had some discharge recently but no itching, burning, or dysuria. Baby moving normally. Some cramping/lower abdominal pain that she thinks is Braxton-Hicks contractions. No complications of this pregnancy or previous. Had intercourse this morning.  Prenatal care at [facility] (Dr. Berline Chough). Last visit 2 weeks ago.  OB History   Grav Para Term Preterm Abortions TAB SAB Ect Mult Living   6 1 1  4 2    1       Past Medical History  Diagnosis Date  . 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 Abnormal Pap smear     Past Surgical History  Procedure Laterality Date  . Cesarean section  10/30/2009    Failure to Progress  . Tooth extraction      Family History  Problem Relation Age of Onset  . Sickle cell anemia Mother   . Multiple sclerosis Father   . Sickle cell anemia Maternal Grandfather   . Cervical cancer Maternal Grandmother   . Schizophrenia Maternal Uncle     History  Substance Use Topics  . Smoking status: Former Smoker -- 0.10 packs/day  . Smokeless tobacco: Not on file  . Alcohol Use: No    Allergies: No Known Allergies  Prescriptions prior to admission  Medication Sig Dispense Refill  . acidophilus (RISAQUAD) CAPS Take 1 capsule by mouth daily.      Marland Kitchen docusate sodium (COLACE) 250 MG capsule Take 1 capsule (250 mg total) by mouth 2 (two) times daily.  60 capsule  0  . polyethylene glycol powder (GLYCOLAX/MIRALAX) powder Take 17 g by mouth daily.  255 g  0  . Prenatal Vit-Fe Fumarate-FA (PRENATAL MULTIVITAMIN) TABS Take 1 tablet by mouth daily at 12 noon.      . Prenatal-FE Bis-FA-DHA w/o A  (COMPLETE PRENATAL/DHA) 30-0.975 & 300 MG MISC Take 1 capsule by mouth daily.  100 each  3    ROS  No nausea, vomiting, fever, chills, diarrhea, constipation. Other pertinent pos/neg listed in HPI.  Physical Exam   Blood pressure 100/57, pulse 90, temperature 97.4 F (36.3 C), temperature source Oral, resp. rate 18, height 5\' 1"  (1.549 m), weight 53.071 kg (117 lb), last menstrual period 09/25/2012, SpO2 100.00%.  Physical Exam  Constitutional: She is oriented to person, place, and time. She appears well-developed and well-nourished. No distress.  HENT:  Head: Normocephalic and atraumatic.  Eyes: Conjunctivae and EOM are normal.  Neck: Normal range of motion. Neck supple.  Cardiovascular: Normal rate, regular rhythm and normal heart sounds.   Respiratory: Effort normal and breath sounds normal. No respiratory distress.  GI: Soft. There is no tenderness. There is no rebound and no guarding.  Genitourinary:  Normal external genitalia and vagina. Minimal white discharge. No fluid or pooling. Cervix closed/thick/high. No CMT. No bleeding.  Musculoskeletal: Normal range of motion. She exhibits no edema and no tenderness.  Neurological: She is alert and oriented to person, place, and time.  Skin: Skin is warm and dry.  Psychiatric: She has a normal mood and affect.    Fern negative  Results for orders placed during  the hospital encounter of 04/19/13 (from the past 48 hour(s))  WET PREP, GENITAL     Status: Abnormal   Collection Time    04/19/13  6:32 PM      Result Value Range   Yeast Wet Prep HPF POC NONE SEEN  NONE SEEN   Trich, Wet Prep NONE SEEN  NONE SEEN   Clue Cells Wet Prep HPF POC NONE SEEN  NONE SEEN   WBC, Wet Prep HPF POC FEW (*) NONE SEEN   Comment: MODERATE BACTERIA SEEN    MAU Course  Procedures  FHTs: 140, min-mod variability, accels present, frequent short variables - approp for gestational age TOCO: irritability, no contractions    Assessment and Plan  27  y.o. [id number] at [redacted]w[redacted]d with normal vaginal discharge - No PPROM - No contractions - No cervical dilation. - NST reactive - Follow up with Dr. Berline Chough as scheduled - Stable for discharge home.  Napoleon Form 04/19/2013, 6:36 PM

## 2013-04-20 ENCOUNTER — Inpatient Hospital Stay (HOSPITAL_COMMUNITY)
Admission: EM | Admit: 2013-04-20 | Discharge: 2013-04-21 | Disposition: A | Discharge status: Home / Self Care | Payer: 59 | Attending: Obstetrics & Gynecology | Admitting: Obstetrics & Gynecology

## 2013-04-20 ENCOUNTER — Encounter (HOSPITAL_COMMUNITY): Payer: Self-pay

## 2013-04-20 DIAGNOSIS — O99891 Other specified diseases and conditions complicating pregnancy: Secondary | ICD-10-CM | POA: Insufficient documentation

## 2013-04-20 DIAGNOSIS — M542 Cervicalgia: Secondary | ICD-10-CM | POA: Insufficient documentation

## 2013-04-20 DIAGNOSIS — D6489 Other specified anemias: Secondary | ICD-10-CM | POA: Insufficient documentation

## 2013-04-20 DIAGNOSIS — B373 Candidiasis of vulva and vagina: Secondary | ICD-10-CM

## 2013-04-20 DIAGNOSIS — O479 False labor, unspecified: Secondary | ICD-10-CM

## 2013-04-20 DIAGNOSIS — R5381 Other malaise: Secondary | ICD-10-CM | POA: Insufficient documentation

## 2013-04-20 DIAGNOSIS — O99019 Anemia complicating pregnancy, unspecified trimester: Secondary | ICD-10-CM | POA: Insufficient documentation

## 2013-04-20 DIAGNOSIS — N760 Acute vaginitis: Secondary | ICD-10-CM

## 2013-04-20 DIAGNOSIS — M79609 Pain in unspecified limb: Secondary | ICD-10-CM | POA: Insufficient documentation

## 2013-04-20 DIAGNOSIS — Y92410 Unspecified street and highway as the place of occurrence of the external cause: Secondary | ICD-10-CM | POA: Insufficient documentation

## 2013-04-20 DIAGNOSIS — Z3482 Encounter for supervision of other normal pregnancy, second trimester: Secondary | ICD-10-CM

## 2013-04-20 DIAGNOSIS — O4700 False labor before 37 completed weeks of gestation, unspecified trimester: Secondary | ICD-10-CM | POA: Insufficient documentation

## 2013-04-20 LAB — GC/CHLAMYDIA PROBE AMP
CT Probe RNA: NEGATIVE
GC Probe RNA: NEGATIVE

## 2013-04-20 MED ORDER — CYCLOBENZAPRINE HCL 10 MG PO TABS
10.0000 mg | ORAL_TABLET | Freq: Once | ORAL | Status: AC
  Administered 2013-04-20: 10 mg via ORAL
  Filled 2013-04-20: qty 1

## 2013-04-20 MED ORDER — ACETAMINOPHEN 325 MG PO TABS
650.0000 mg | ORAL_TABLET | Freq: Once | ORAL | Status: AC
  Administered 2013-04-20: 650 mg via ORAL
  Filled 2013-04-20: qty 2
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
## 2013-04-20 NOTE — ED Notes (Signed)
OB at bedside
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
## 2013-04-21 ENCOUNTER — Encounter (HOSPITAL_COMMUNITY): Payer: Self-pay | Admitting: *Deleted

## 2013-04-21 ENCOUNTER — Inpatient Hospital Stay (HOSPITAL_COMMUNITY): Payer: 59

## 2013-04-21 DIAGNOSIS — M542 Cervicalgia: Secondary | ICD-10-CM

## 2013-04-21 LAB — CBC
HCT: 24.4 % — ABNORMAL LOW (ref 36.0–46.0)
HCT: 24.9 % — ABNORMAL LOW (ref 36.0–46.0)
Hemoglobin: 8.6 g/dL — ABNORMAL LOW (ref 12.0–15.0)
Hemoglobin: 8.7 g/dL — ABNORMAL LOW (ref 12.0–15.0)
MCHC: 35.2 g/dL (ref 30.0–36.0)
MCHC: 34.9 g/dL (ref 30.0–36.0)
MCV: 85.9 fL (ref 78.0–100.0)
MCV: 86.2 fL (ref 78.0–100.0)
WBC: 6.8 10*3/uL (ref 4.0–10.5)

## 2013-04-21 LAB — KLEIHAUER-BETKE STAIN: Quantitation Fetal Hemoglobin: 0 mL

## 2013-04-21 MED ORDER — SODIUM CHLORIDE 0.9 % IV BOLUS (SEPSIS)
500.0000 mL | Freq: Once | INTRAVENOUS | Status: AC
  Administered 2013-04-21: 500 mL via INTRAVENOUS

## 2013-04-21 MED ORDER — INTEGRA PLUS PO CAPS: 1.0000 | ORAL_CAPSULE | Freq: Every morning | ORAL | Status: DC
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
## 2013-04-21 NOTE — Progress Notes (Signed)
Pt is a G6P1, 26wk0days, FHT 140-150, reactive. UC's q4-5 minutes. No vag bleed or leaking. Pt states good fetal movement and is aware of tightening but it is not painful. Dr. Macon Large notified, requested pt be transferred to MAU.

## 2013-04-21 NOTE — ED Provider Notes (Signed)
Medical screening examination/treatment/procedure(s) were performed by non-physician practitioner and as supervising physician I was immediately available for consultation/collaboration.   Hanley Seamen, MD 04/21/13 [phone]
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
## 2013-04-21 NOTE — MAU Provider Note (Signed)
Attestation of Attending Supervision of Advanced Practitioner (CNM/NP): Evaluation and management procedures were performed by the Advanced Practitioner under my supervision and collaboration.  I have reviewed the Advanced Practitioner's note and chart, and I agree with the management and plan.  Keaja Reaume 04/21/2013 8:46 AM   

## 2013-04-24 NOTE — MAU Provider Note (Signed)
Attestation of Attending Supervision of Resident: Evaluation and management procedures were performed by the Family Medicine Resident under my supervision.  I have reviewed the resident's note and chart, and I agree with the management and plan.  Frankee Gritz, MD, FACOG Attending Obstetrician & Gynecologist Faculty Practice, [facility]  

## 2013-04-26 ENCOUNTER — Telehealth: Payer: Self-pay | Admitting: Sports Medicine

## 2013-04-26 NOTE — Telephone Encounter (Signed)
Dr Berline Chough patient was seen at the hospital,unable to give letter stating she was seen in office,patient states you told her to take the rest of the week off.Patient requesting letter for work  from the 30th til today. Kamaria Lucia, [name]
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
## 2013-04-27 NOTE — Telephone Encounter (Signed)
Letter written.  Please print and provide for pt to come by today. No referal needed for Chiropractor with private insurance but I can offer pt OMT in clinic as well.  Please schedule pt for routine OB appointment ASAP and I can see her for manipulation at that visit.  She is more than welcome to see chiropractor as well but I can offer these services if requesting.  [name], DO [facility] Resident - PGY-2 04/27/2013 7:20 AM

## 2013-04-29 ENCOUNTER — Other Ambulatory Visit: Payer: Self-pay | Admitting: Family Medicine

## 2013-04-29 ENCOUNTER — Ambulatory Visit (INDEPENDENT_AMBULATORY_CARE_PROVIDER_SITE_OTHER): Payer: 59 | Admitting: Family Medicine

## 2013-04-29 ENCOUNTER — Telehealth: Payer: Self-pay | Admitting: *Deleted

## 2013-04-29 ENCOUNTER — Encounter: Payer: Self-pay | Admitting: Sports Medicine

## 2013-04-29 ENCOUNTER — Encounter: Payer: Self-pay | Admitting: Family Medicine

## 2013-04-29 VITALS — BP 110/66 | Temp 97.9°F | Wt 125.1 lb

## 2013-04-29 DIAGNOSIS — J45909 Unspecified asthma, uncomplicated: Secondary | ICD-10-CM | POA: Insufficient documentation

## 2013-04-29 DIAGNOSIS — Z23 Encounter for immunization: Secondary | ICD-10-CM

## 2013-04-29 DIAGNOSIS — Z3480 Encounter for supervision of other normal pregnancy, unspecified trimester: Secondary | ICD-10-CM

## 2013-04-29 DIAGNOSIS — Z3482 Encounter for supervision of other normal pregnancy, second trimester: Secondary | ICD-10-CM

## 2013-04-29 LAB — CBC
Hemoglobin: 9.9 g/dL — ABNORMAL LOW (ref 12.0–15.0)
MCH: 30.1 pg (ref 26.0–34.0)
MCHC: 34.7 g/dL (ref 30.0–36.0)
MCV: 86.6 fL (ref 78.0–100.0)
RBC: 3.29 MIL/uL — ABNORMAL LOW (ref 3.87–5.11)

## 2013-04-29 LAB — GLUCOSE, CAPILLARY: Comment 1: 1

## 2013-04-29 MED ORDER — BECLOMETHASONE DIPROPIONATE 80 MCG/ACT IN AERS: 1.0000 | INHALATION_SPRAY | Freq: Two times a day (BID) | RESPIRATORY_TRACT | Status: DC

## 2013-04-29 NOTE — Assessment & Plan Note (Signed)
-   See flowsheet and visit notes for plan.

## 2013-04-29 NOTE — Telephone Encounter (Signed)
[name] patient showed up an hour late Dr Mauricio Po did see her today 04/29/2013.Morgan Todd, Morgan Todd

## 2013-04-29 NOTE — Telephone Encounter (Signed)
Patient was scheduled to be seen today and NO SHOWED. Morgan Todd, [name]

## 2013-04-29 NOTE — Patient Instructions (Addendum)
It was good to see you in [facility] clinic today. Baby is measuring well and sounds good. You are at 27 weeks 2 days today.  For your asthma, start QVAR inhaler with spacer one puff twice daily (prescription sent to [facility] on [address]). Keep a log of how often you are needing your ALBUTEROL inhaler and bring this to your next appointment. For your back pain, this is not concerning at the moment because you are not having any bowel/bladder changes, weakness, or complete numbness. If any of these develop, return sooner or go to the ED for evaluation. If pain becomes severe, follow-up sooner with Dr. Berline Chough if you are needing medication. Stay away from ibuprofen for now. I am giving you a note today for your work to allow you to be seated as frequently as possible. We are getting labs today and will call you if anything is NOT normal. Continue to think about what kind of birth control you would like. Follow up with Dr. Berline Chough in 2 weeks or sooner if any concerns arise.  If you develop vaginal bleeding, worsened contractions, loss of fluid like your water has broken, or baby stops moving, please seek immediate medical attention at [facility].     Fetal Movement Counts Patient Name: __________________________________________________ Patient Due Date: ____________________ Melody Haver counts is highly recommended in high risk pregnancies, but it is a good idea for every pregnant woman to do. Start counting fetal movements at 28 weeks of the pregnancy. Fetal movements increase after eating a full meal or eating or drinking something sweet (the blood sugar is higher). It is also important to drink plenty of fluids (well hydrated) before doing the count. Lie on your left side because it helps with the circulation or you can sit in a comfortable chair with your arms over your belly (abdomen) with no distractions around you. DOING THE COUNT  Try to do the count the same time of day each time you do it.  Mark the  day and time, then see how long it takes for you to feel 10 movements (kicks, flutters, swishes, rolls). You should have at least 10 movements within 2 hours. You will most likely feel 10 movements in much less than 2 hours. If you do not, wait an hour and count again. After a couple of days you will see a pattern.  What you are looking for is a change in the pattern or not enough counts in 2 hours. Is it taking longer in time to reach 10 movements? SEEK MEDICAL CARE IF:  You feel less than 10 counts in 2 hours. Tried twice.  No movement in one hour.  The pattern is changing or taking longer each day to reach 10 counts in 2 hours.  You feel the baby is not moving as it usually does.
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
## 2013-05-01 ENCOUNTER — Encounter: Payer: Self-pay | Admitting: Sports Medicine

## 2013-05-04 ENCOUNTER — Telehealth: Payer: Self-pay | Admitting: Sports Medicine

## 2013-05-04 ENCOUNTER — Encounter: Payer: 59 | Admitting: Family Medicine

## 2013-05-04 NOTE — Telephone Encounter (Signed)
Patient dropped off fmla papers for be filled out.  She is also needing a note for work stating that she cannot stand all day at work.  Her workplace is saying that they do not have any positions where she can sit and work so they are not letting her work.  Please call her if you have questions regarding this.

## 2013-05-07 ENCOUNTER — Encounter: Payer: Self-pay | Admitting: Sports Medicine

## 2013-05-10 NOTE — Telephone Encounter (Signed)
pls close encounter

## 2013-05-12 ENCOUNTER — Ambulatory Visit (INDEPENDENT_AMBULATORY_CARE_PROVIDER_SITE_OTHER): Payer: 59 | Admitting: Family Medicine

## 2013-05-12 VITALS — BP 109/71 | Wt 127.0 lb

## 2013-05-12 DIAGNOSIS — Z3480 Encounter for supervision of other normal pregnancy, unspecified trimester: Secondary | ICD-10-CM

## 2013-05-12 DIAGNOSIS — Z3482 Encounter for supervision of other normal pregnancy, second trimester: Secondary | ICD-10-CM

## 2013-05-12 MED ORDER — CLOTRIMAZOLE 1 % VA CREA: TOPICAL_CREAM | VAGINAL | Status: DC

## 2013-05-12 NOTE — Patient Instructions (Addendum)
You can try cream if itching does not improve. Safe to use tylenol for back pain. Try heat or ice three times daily. Good to keep walking and being active. Make an appointment with Dr. Berline Chough in 2 weeks.
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
## 2013-05-18 NOTE — Telephone Encounter (Signed)
Pt called again about her FMLA forms. Would like to pick them up Thurs when she comes in for her sons drs appt

## 2013-05-18 NOTE — Telephone Encounter (Signed)
Will fwd message to MD for advice.   Radene Ou, [place]'

## 2013-05-19 ENCOUNTER — Encounter: Payer: Self-pay | Admitting: Sports Medicine

## 2013-05-19 DIAGNOSIS — O9A212 Injury, poisoning and certain other consequences of external causes complicating pregnancy, second trimester: Secondary | ICD-10-CM | POA: Insufficient documentation

## 2013-05-19 DIAGNOSIS — M549 Dorsalgia, unspecified: Secondary | ICD-10-CM | POA: Insufficient documentation

## 2013-05-19 DIAGNOSIS — O9989 Other specified diseases and conditions complicating pregnancy, childbirth and the puerperium: Secondary | ICD-10-CM

## 2013-05-19 NOTE — Telephone Encounter (Signed)
Paperwork completed and placed on Lupita Leash as desk for patient to pick up tomorrow at her child's appointment

## 2013-05-20 NOTE — Telephone Encounter (Signed)
error 

## 2013-05-27 ENCOUNTER — Other Ambulatory Visit (HOSPITAL_COMMUNITY): Payer: Self-pay | Admitting: Advanced Practice Midwife

## 2013-05-27 ENCOUNTER — Ambulatory Visit (INDEPENDENT_AMBULATORY_CARE_PROVIDER_SITE_OTHER): Payer: 59 | Admitting: Sports Medicine

## 2013-05-27 VITALS — BP 104/66 | Temp 98.8°F | Wt 131.0 lb

## 2013-05-27 DIAGNOSIS — M999 Biomechanical lesion, unspecified: Secondary | ICD-10-CM

## 2013-05-27 DIAGNOSIS — Z3483 Encounter for supervision of other normal pregnancy, third trimester: Secondary | ICD-10-CM

## 2013-05-27 DIAGNOSIS — O9989 Other specified diseases and conditions complicating pregnancy, childbirth and the puerperium: Secondary | ICD-10-CM

## 2013-05-27 DIAGNOSIS — M9905 Segmental and somatic dysfunction of pelvic region: Secondary | ICD-10-CM

## 2013-05-27 DIAGNOSIS — Z3480 Encounter for supervision of other normal pregnancy, unspecified trimester: Secondary | ICD-10-CM

## 2013-05-27 DIAGNOSIS — M549 Dorsalgia, unspecified: Secondary | ICD-10-CM

## 2013-05-27 NOTE — Patient Instructions (Addendum)
Follow up in 2 weeks. Use your flexeril  Pregnancy - Third Trimester The third trimester of pregnancy (the last 3 months) is a period of the most rapid growth for you and your baby. The baby approaches a length of 20 inches and a weight of 6 to 10 pounds. The baby is adding on fat and getting ready for life outside your body. While inside, babies have periods of sleeping and waking, sucking thumbs, and hiccuping. You can often feel small contractions of the uterus. This is false labor. It is also called Braxton-Hicks contractions. This is like a practice for labor. The usual problems in this stage of pregnancy include more difficulty breathing, swelling of the hands and feet from water retention, and having to urinate more often because of the uterus and baby pressing on your bladder.  PRENATAL EXAMS  Blood work may continue to be done during prenatal exams. These tests are done to check on your health and the probable health of your baby. Blood work is used to follow your blood levels (hemoglobin). Anemia (low hemoglobin) is common during pregnancy. Iron and vitamins are given to help prevent this. You may also continue to be checked for diabetes. Some of the past blood tests may be done again.  The size of the uterus is measured during each visit. This makes sure your baby is growing properly according to your pregnancy dates.  Your blood pressure is checked every prenatal visit. This is to make sure you are not getting toxemia.  Your urine is checked every prenatal visit for infection, diabetes, and protein.  Your weight is checked at each visit. This is done to make sure gains are happening at the suggested rate and that you and your baby are growing normally.  Sometimes, an ultrasound is performed to confirm the position and the proper growth and development of the baby. This is a test done that bounces harmless sound waves off the baby so your caregiver can more accurately determine a due  date.  Discuss the type of pain medicine and anesthesia you will have during your labor and delivery.  Discuss the possibility and anesthesia if a cesarean section might be necessary.  Inform your caregiver if there is any mental or physical violence at home. Sometimes, a specialized non-stress test, contraction stress test, and biophysical profile are done to make sure the baby is not having a problem. Checking the amniotic fluid surrounding the baby is called an amniocentesis. The amniotic fluid is removed by sticking a needle into the belly (abdomen). This is sometimes done near the end of pregnancy if an early delivery is required. In this case, it is done to help make sure the baby's lungs are mature enough for the baby to live outside of the womb. If the lungs are not mature and it is unsafe to deliver the baby, an injection of cortisone medicine is given to the mother 1 to 2 days before the delivery. This helps the baby's lungs mature and makes it safer to deliver the baby. CHANGES OCCURING IN THE THIRD TRIMESTER OF PREGNANCY Your body goes through many changes during pregnancy. They vary from person to person. Talk to your caregiver about changes you notice and are concerned about.  During the last trimester, you have probably had an increase in your appetite. It is normal to have cravings for certain foods. This varies from person to person and pregnancy to pregnancy.  You may begin to get stretch marks on your hips, abdomen,  and breasts. These are normal changes in the body during pregnancy. There are no exercises or medicines to take which prevent this change.  Constipation may be treated with a stool softener or adding bulk to your diet. Drinking lots of fluids, fiber in vegetables, fruits, and whole grains are helpful.  Exercising is also helpful. If you have been very active up until your pregnancy, most of these activities can be continued during your pregnancy. If you have been less  active, it is helpful to start an exercise program such as walking. Consult your caregiver before starting exercise programs.  Avoid all smoking, alcohol, non-prescribed drugs, herbs and "street drugs" during your pregnancy. These chemicals affect the formation and growth of the baby. Avoid chemicals throughout the pregnancy to ensure the delivery of a healthy infant.  Backache, varicose veins, and hemorrhoids may develop or get worse.  You will tire more easily in the third trimester, which is normal.  The baby's movements may be stronger and more often.  You may become short of breath easily.  Your belly button may stick out.  A yellow discharge may leak from your breasts called colostrum.  You may have a bloody mucus discharge. This usually occurs a few days to a week before labor begins. HOME CARE INSTRUCTIONS   Keep your caregiver's appointments. Follow your caregiver's instructions regarding medicine use, exercise, and diet.  During pregnancy, you are providing food for you and your baby. Continue to eat regular, well-balanced meals. Choose foods such as meat, fish, milk and other low fat dairy products, vegetables, fruits, and whole-grain breads and cereals. Your caregiver will tell you of the ideal weight gain.  A physical sexual relationship may be continued throughout pregnancy if there are no other problems such as early (premature) leaking of amniotic fluid from the membranes, vaginal bleeding, or belly (abdominal) pain.  Exercise regularly if there are no restrictions. Check with your caregiver if you are unsure of the safety of your exercises. Greater weight gain will occur in the last 2 trimesters of pregnancy. Exercising helps:  Control your weight.  Get you in shape for labor and delivery.  You lose weight after you deliver.  Rest a lot with legs elevated, or as needed for leg cramps or low back pain.  Wear a good support or jogging bra for breast tenderness during  pregnancy. This may help if worn during sleep. Pads or tissues may be used in the bra if you are leaking colostrum.  Do not use hot tubs, steam rooms, or saunas.  Wear your seat belt when driving. This protects you and your baby if you are in an accident.  Avoid raw meat, cat litter boxes and soil used by cats. These carry germs that can cause birth defects in the baby.  It is easier to leak urine during pregnancy. Tightening up and strengthening the pelvic muscles will help with this problem. You can practice stopping your urination while you are going to the bathroom. These are the same muscles you need to strengthen. It is also the muscles you would use if you were trying to stop from passing gas. You can practice tightening these muscles up 10 times a set and repeating this about 3 times per day. Once you know what muscles to tighten up, do not perform these exercises during urination. It is more likely to cause an infection by backing up the urine.  Ask for help if you have financial, counseling, or nutritional needs during pregnancy. Your  caregiver will be able to offer counseling for these needs as well as refer you for other special needs.  Make a list of emergency phone numbers and have them available.  Plan on getting help from family or friends when you go home from the hospital.  Make a trial run to the hospital.  Take prenatal classes with the father to understand, practice, and ask questions about the labor and delivery.  Prepare the baby's room or nursery.  Do not travel out of the city unless it is absolutely necessary and with the advice of your caregiver.  Wear only low or no heal shoes to have better balance and prevent falling. MEDICINES AND DRUG USE IN PREGNANCY  Take prenatal vitamins as directed. The vitamin should contain 1 milligram of folic acid. Keep all vitamins out of reach of children. Only a couple vitamins or tablets containing iron may be fatal to a baby or  young child when ingested.  Avoid use of all medicines, including herbs, over-the-counter medicines, not prescribed or suggested by your caregiver. Only take over-the-counter or prescription medicines for pain, discomfort, or fever as directed by your caregiver. Do not use aspirin, ibuprofen or naproxen unless approved by your caregiver.  Let your caregiver also know about herbs you may be using.  Alcohol is related to a number of birth defects. This includes fetal alcohol syndrome. All alcohol, in any form, should be avoided completely. Smoking will cause low birth rate and premature babies.  Illegal drugs are very harmful to the baby. They are absolutely forbidden. A baby born to an addicted mother will be addicted at birth. The baby will go through the same withdrawal an adult does. SEEK MEDICAL CARE IF: You have any concerns or worries during your pregnancy. It is better to call with your questions if you feel they cannot wait, rather than worry about them. SEEK IMMEDIATE MEDICAL CARE IF:   An unexplained oral temperature above 102 F (38.9 C) develops, or as your caregiver suggests.  You have leaking of fluid from the vagina. If leaking membranes are suspected, take your temperature and tell your caregiver of this when you call.  There is vaginal spotting, bleeding or passing clots. Tell your caregiver of the amount and how many pads are used.  You develop a bad smelling vaginal discharge with a change in the color from clear to white.  You develop vomiting that lasts more than 24 hours.  You develop chills or fever.  You develop shortness of breath.  You develop burning on urination.  You loose more than 2 pounds of weight or gain more than 2 pounds of weight or as suggested by your caregiver.  You notice sudden swelling of your face, hands, and feet or legs.  You develop belly (abdominal) pain. Round ligament discomfort is a common non-cancerous (benign) cause of abdominal  pain in pregnancy. Your caregiver still must evaluate you.  You develop a severe headache that does not go away.  You develop visual problems, blurred or double vision.  If you have not felt your baby move for more than 1 hour. If you think the baby is not moving as much as usual, eat something with sugar in it and lie down on your left side for an hour. The baby should move at least 4 to 5 times per hour. Call right away if your baby moves less than that.  You fall, are in a car accident, or any kind of trauma.  There is  mental or physical violence at home. Document Released: 12/03/2001 Document Revised: 09/02/2012 Document Reviewed: 06/07/2009 [facility] Patient Information 2014 [place], [place].

## 2013-05-27 NOTE — Progress Notes (Signed)
Pt is a 27 y.o. [id number] [redacted]w[redacted]d by [place] who presents today for  Routine prenatal care.  Reports stable left low back pain occasionally radiating to her left buttock and thigh.  More comfortable in sitting position.  Has not been taking Flexeril. Reports good fetal movement, no bleeding, no discharge, no headaches or vision changes. Reviewed K. counts and preterm labor precautions.   Return in 2 weeks.  Referred to OB clinic to discuss VBAC Updated pregnancy overview

## 2013-05-27 NOTE — Assessment & Plan Note (Addendum)
Decision today to treat with OMT was based on Physical Exam.  Verbal consent was obtained after explanation of risks, benefits and potential side-effects including acute pain flare, post-manipulation soreness, and need for repeat treatments.    The Patient was treated with long lever, muscle energy, articulatory techniques in lower extremity, pelvic, lumbar spine areas  Patient tolerated the procedure well with improvement in symptoms.  Patient given medications, exercises, stretches and lifestyle modifications per AVS and verbally.    Patient will follow up in 2 weeks

## 2013-06-02 ENCOUNTER — Telehealth: Payer: Self-pay | Admitting: Sports Medicine

## 2013-06-02 ENCOUNTER — Ambulatory Visit: Payer: 59 | Admitting: Emergency Medicine

## 2013-06-02 NOTE — Telephone Encounter (Signed)
If worsening headache at [redacted] weeks pregnant should probably be seen in clinic.  Ensure plenty of PO fluids and okay to take up to 4g of Tylenol (8X500mg  tablets) in a single day however if needing more than that daily needs to take no more than 3g.  Recommend being seen as same day visit.  If cannot come in today then okay to be seen in AM.    If vision loss/changes, lethargy or profound weakness needs to be seen in MAU tonight.

## 2013-06-02 NOTE — Telephone Encounter (Signed)
Pt is [redacted] weeks pregnant. Has a headache since yesterday. Has taken 4 extra strength tylenol since yesterday but no relief.  Wants to know if there is a limit to the amount she can take or is there something else to take? Please advise

## 2013-06-02 NOTE — Telephone Encounter (Signed)
Returned call to pt - reports that headache has decreased without the aid of anymore meds. States that she has been staying hydrated -does not believe that she needs to come in today but agreed to come at 3:00 tomorrow. Given warning signs including vision/loss,lethargy or weakness to go immediately to MAU. Pt verbalized understanding.  Wyatt Haste, RN-BSN

## 2013-06-03 ENCOUNTER — Encounter: Payer: Self-pay | Admitting: Family Medicine

## 2013-06-03 ENCOUNTER — Ambulatory Visit (INDEPENDENT_AMBULATORY_CARE_PROVIDER_SITE_OTHER): Payer: 59 | Admitting: Family Medicine

## 2013-06-03 VITALS — BP 99/64 | HR 79 | Temp 98.8°F

## 2013-06-03 DIAGNOSIS — O219 Vomiting of pregnancy, unspecified: Secondary | ICD-10-CM

## 2013-06-03 MED ORDER — ONDANSETRON HCL 4 MG PO TABS: 4.0000 mg | ORAL_TABLET | Freq: Three times a day (TID) | ORAL | Status: DC | PRN

## 2013-06-03 NOTE — Progress Notes (Signed)
Pt here upon recommendation of Dr. Berline Chough for ongoing headache with nausea. Pt states that it has gotten better but is unable to eat due to nausea - pt is [redacted] weeks pregnant. Placed in sub waiting for 3:00 appointment, name and triage sheet placed on OF board  Corrie Dandy , CMA aware.\ Wyatt Haste, RN-BSN

## 2013-06-03 NOTE — Assessment & Plan Note (Addendum)
She is afebrile without any diarrhea or abdominal pain, less likely viral or bacterial infection.  May be worsening GERD or nausea secondary to pregnancy.  Rx for Zofran sent to pharmacy.  Continue Tums as needed for reflux.  Stressed importance of hydration.  If vomiting persists or worsens, patient to go MAU.  Follow up with PCP for routine OB check up.

## 2013-06-03 NOTE — Patient Instructions (Addendum)
It was nice to meet you today, Morgan Todd. Take Zofran 1-2 tablets every 8 hours as needed for nausea/vomiting. Try to drink 6-8 glasses of water/fluids per day to stay hydrated. For reflux, continue to take Tums or Rolaids as needed. Follow up with your PCP for routine check up. If nausea/vomiting persists or worsens, please go to MAU for evaluation.

## 2013-06-03 NOTE — Progress Notes (Signed)
  Subjective:    Patient ID: [name], female    DOB: 1986/03/12, 27 y.o.   MRN: [id number]  HPI  Patient presents to same day clinic for nausea/vomiting.  Started 3 days ago.  She last vomited yesterday, but she continues to feel nauseated.  She was able to drink water and juice today, but she has bad reflux symptoms.  She takes Tums for reflux which helped.  She was unable to eat solid foods today.  She denies any sick contacts.  Denies any fevers, chills, night sweats.  Patient continues to feel good fetal movement.  Denies any vaginal bleeding, leaky fluid, or discharge.  Review of Systems Per HPI    Objective:   Physical Exam  Constitutional: She appears well-nourished. No distress.  Cardiovascular: Normal rate.   Pulmonary/Chest: Effort normal.  Abdominal:  Gravid, non-tender, soft, ND, FHR 140s  Neurological:  Grossly normal      Assessment & Plan:

## 2013-06-04 ENCOUNTER — Encounter: Payer: Self-pay | Admitting: *Deleted

## 2013-06-10 ENCOUNTER — Ambulatory Visit (INDEPENDENT_AMBULATORY_CARE_PROVIDER_SITE_OTHER): Payer: 59 | Admitting: Family Medicine

## 2013-06-10 VITALS — BP 102/56 | Temp 98.9°F | Wt 135.0 lb

## 2013-06-10 DIAGNOSIS — O99019 Anemia complicating pregnancy, unspecified trimester: Secondary | ICD-10-CM

## 2013-06-10 LAB — CBC WITH DIFFERENTIAL/PLATELET
Basophils Absolute: 0 10*3/uL (ref 0.0–0.1)
HCT: 27.9 % — ABNORMAL LOW (ref 36.0–46.0)
Hemoglobin: 9.7 g/dL — ABNORMAL LOW (ref 12.0–15.0)
Lymphocytes Relative: 16 % (ref 12–46)
Lymphs Abs: 1 10*3/uL (ref 0.7–4.0)
Monocytes Absolute: 0.5 10*3/uL (ref 0.1–1.0)
Monocytes Relative: 8 % (ref 3–12)
Neutro Abs: 4.7 10*3/uL (ref 1.7–7.7)
WBC: 6.2 10*3/uL (ref 4.0–10.5)

## 2013-06-10 NOTE — Progress Notes (Signed)
Morgan Todd is 27 y.o. is [id number] [redacted]w[redacted]d here for her routine prenatal appt. She has no major complaints, she does have increased fatigue. She is not taking her PNV because it is making her ill. She is feeling her baby move frequently. She denies LOF, vaginal bleeding or regular contractions.   O: BP 102/56  Temp(Src) 98.9 F (37.2 C)  Wt 135 lb (61.236 kg)  BMI 25.52 kg/m2  LMP 09/25/2012 Abd: gravid  A/P: - VBAC papers signed - A positive - Boy baby Elige Radon), circ desired outpatient - BC: Implanon - GBS culture next visit - encourage to take PNV - PTL and bleeding precautions reviewed.  - CBC today for increased fatigue.  - desires to BF - 2 week f/u with Berline Chough.

## 2013-06-10 NOTE — Patient Instructions (Signed)

## 2013-06-24 ENCOUNTER — Telehealth: Payer: Self-pay | Admitting: *Deleted

## 2013-06-24 ENCOUNTER — Encounter: Payer: 59 | Admitting: Sports Medicine

## 2013-06-24 ENCOUNTER — Encounter (HOSPITAL_COMMUNITY): Payer: Self-pay | Admitting: *Deleted

## 2013-06-24 ENCOUNTER — Inpatient Hospital Stay (HOSPITAL_COMMUNITY)
Admission: AD | Admit: 2013-06-24 | Discharge: 2013-06-24 | Disposition: A | Discharge status: Home / Self Care | Payer: 59 | Source: Ambulatory Visit | Attending: Obstetrics & Gynecology | Admitting: Obstetrics & Gynecology

## 2013-06-24 DIAGNOSIS — Z3482 Encounter for supervision of other normal pregnancy, second trimester: Secondary | ICD-10-CM

## 2013-06-24 DIAGNOSIS — K5900 Constipation, unspecified: Secondary | ICD-10-CM | POA: Insufficient documentation

## 2013-06-24 DIAGNOSIS — M549 Dorsalgia, unspecified: Secondary | ICD-10-CM

## 2013-06-24 DIAGNOSIS — N760 Acute vaginitis: Secondary | ICD-10-CM

## 2013-06-24 DIAGNOSIS — O228X9 Other venous complications in pregnancy, unspecified trimester: Secondary | ICD-10-CM | POA: Insufficient documentation

## 2013-06-24 DIAGNOSIS — O4700 False labor before 37 completed weeks of gestation, unspecified trimester: Secondary | ICD-10-CM | POA: Insufficient documentation

## 2013-06-24 DIAGNOSIS — B373 Candidiasis of vulva and vagina: Secondary | ICD-10-CM

## 2013-06-24 DIAGNOSIS — O9A212 Injury, poisoning and certain other consequences of external causes complicating pregnancy, second trimester: Secondary | ICD-10-CM

## 2013-06-24 DIAGNOSIS — O4702 False labor before 37 completed weeks of gestation, second trimester: Secondary | ICD-10-CM

## 2013-06-24 DIAGNOSIS — K649 Unspecified hemorrhoids: Secondary | ICD-10-CM | POA: Insufficient documentation

## 2013-06-24 DIAGNOSIS — M9905 Segmental and somatic dysfunction of pelvic region: Secondary | ICD-10-CM

## 2013-06-24 DIAGNOSIS — M999 Biomechanical lesion, unspecified: Secondary | ICD-10-CM

## 2013-06-24 LAB — URINALYSIS, ROUTINE W REFLEX MICROSCOPIC
Glucose, UA: NEGATIVE mg/dL
Hgb urine dipstick: NEGATIVE
Specific Gravity, Urine: 1.015 (ref 1.005–1.030)
Urobilinogen, UA: 0.2 mg/dL (ref 0.0–1.0)
pH: 7 (ref 5.0–8.0)

## 2013-06-24 NOTE — MAU Note (Signed)
Patient states she is having contractions every 5-7 minutes. Denies bleeding or leaking and reports good fetal movement.  

## 2013-06-24 NOTE — MAU Provider Note (Signed)
History     CSN: [id number]  Arrival date and time: 06/24/13 1103   First Provider Initiated Contact with Patient 06/24/13 1136      Chief Complaint  Patient presents with  . Labor Eval   HPI 26yo [id number] at [redacted]w[redacted]d here for labor check. Pt says she started feeling contractions at 8:45 this morning, every 5-7 minutes. No bleeding, no loss of fluid. No other pain. Positive fetal movement. Has appt later today with [facility]. Also complains of having constipation for the past few days and some blood on toilet paper after BM.  OB Hx: Previous c-section   Past Medical History  Diagnosis Date  . 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 Abnormal Pap smear   . BARTHOLIN'S CYST ABSCESS 06/07/2010    Qualifier: Diagnosis of  By: Benjamin Stain MD, Maisie Fus    . ABNORMAL PAP SMEAR, LGSIL 01/28/2011    In 2012     Past Surgical History  Procedure Laterality Date  . Cesarean section  10/30/2009    Failure to Progress  . Tooth extraction      Family History  Problem Relation Age of Onset  . Sickle cell anemia Mother   . Multiple sclerosis Father   . Sickle cell anemia Maternal Grandfather   . Cervical cancer Maternal Grandmother   . Schizophrenia Maternal Uncle     History  Substance Use Topics  . Smoking status: Former Smoker -- 0.10 packs/day  . Smokeless tobacco: Not on file  . Alcohol Use: No    Allergies: No Known Allergies  Prescriptions prior to admission  Medication Sig Dispense Refill  . acetaminophen (TYLENOL) 500 MG tablet Take 1,000 mg by mouth every 6 (six) hours as needed for pain.      . beclomethasone (QVAR) 80 MCG/ACT inhaler Inhale 1 puff into the lungs 2 (two) times daily.      . calcium carbonate (TUMS EX) 750 MG chewable tablet Chew 1 tablet by mouth daily as needed for heartburn.      . cyclobenzaprine (FLEXERIL) 10 MG tablet TAKE 1 TABLET (10 MG TOTAL) BY MOUTH 3 (THREE) TIMES DAILY AS NEEDED FOR MUSCLE SPASMS.   30 tablet  0  . FeFum-FePoly-FA-B Cmp-C-Biot (INTEGRA PLUS) CAPS Take 1 capsule by mouth every morning.  30 capsule  5  . polyethylene glycol powder (GLYCOLAX/MIRALAX) powder Take 17 g by mouth daily as needed (for constipation).      . Prenatal Vit-Fe Fumarate-FA (PRENATAL MULTIVITAMIN) TABS Take 1 tablet by mouth at bedtime.       . [DISCONTINUED] beclomethasone (QVAR) 80 MCG/ACT inhaler Inhale 1 puff into the lungs 2 (two) times daily.  1 Inhaler  12    ROS negative except as above Physical Exam   Height 4\' 11"  (1.499 m), weight 66.497 kg (146 lb 9.6 oz), last menstrual period 09/25/2012.  Physical Exam General appearance: alert and no distress Head: Normocephalic, without obvious abnormality, atraumatic Lungs: clear to auscultation bilaterally Heart: regular rate and rhythm and S1, S2 normal Abdomen: gravid, non-tender to palpation  Dilation: Closed Effacement (%): Thick Cervical Position: Posterior Exam by:: Dr. Delford Field D.Ahriyah Vannest,CNM  EFM: 130bpm, mod variability, accels present, no decels  MAU Course  Procedures Results for orders placed during the hospital encounter of 06/24/13 (from the past 24 hour(s))  URINALYSIS, ROUTINE W REFLEX MICROSCOPIC     Status: None   Collection Time    06/24/13 11:00 AM      Result  Value Range   Color, Urine YELLOW  YELLOW   APPearance CLEAR  CLEAR   Specific Gravity, Urine 1.015  1.005 - 1.030   pH 7.0  5.0 - 8.0   Glucose, UA NEGATIVE  NEGATIVE mg/dL   Hgb urine dipstick NEGATIVE  NEGATIVE   Bilirubin Urine NEGATIVE  NEGATIVE   Ketones, ur NEGATIVE  NEGATIVE mg/dL   Protein, ur NEGATIVE  NEGATIVE mg/dL   Urobilinogen, UA 0.2  0.0 - 1.0 mg/dL   Nitrite NEGATIVE  NEGATIVE   Leukocytes, UA NEGATIVE  NEGATIVE    Assessment and Plan  Labor check -Cervix closed -Stable for discharge -Keep appt for today with [facility]  Hemorrhoids/constipation -Offered enema here, patient declined -Take colace as already prescribed -Counseled about  diet -Stop iron tablet for a week if it does not get better  Tawni Carnes 06/24/2013, 12:01 PM   Evaluation and management procedures were performed by Resident physician under my supervision/collaboration. Chart reviewed, patient examined by me and I agree with management and plan. Took Miralax last night. PT UCs q 2-6 min x30-40 sec, minimally palpable; no fundal or incisional tenderness. Danae Orleans, CNM 06/24/2013 1:34 PM

## 2013-06-24 NOTE — Telephone Encounter (Addendum)
Will be on the lookout on MAU board. MAU Provider: If to be admitted please page me @ [phone]

## 2013-06-24 NOTE — MAU Provider Note (Signed)
Attestation of Attending Supervision of Advanced Practitioner (PA/CNM/NP): Evaluation and management procedures were performed by the Advanced Practitioner under my supervision and collaboration.  I have reviewed the Advanced Practitioner's note and chart, and I agree with the management and plan.  Harrington Jobe, MD, FACOG Attending Obstetrician & Gynecologist Faculty Practice, [facility]  

## 2013-06-24 NOTE — Telephone Encounter (Signed)
Reports that wife is having contractions that are 5 minutes apart - no bleeding or spotting , no rupture of membranes but contractions are regular - couple does not have transportation at the current time - recommended that they proceed to [facility] for futher eval. Wyatt Haste, RN-BSN

## 2013-07-01 ENCOUNTER — Ambulatory Visit (INDEPENDENT_AMBULATORY_CARE_PROVIDER_SITE_OTHER): Payer: 59 | Admitting: Sports Medicine

## 2013-07-01 VITALS — BP 111/69 | Temp 98.5°F | Wt 141.0 lb

## 2013-07-01 DIAGNOSIS — Z3483 Encounter for supervision of other normal pregnancy, third trimester: Secondary | ICD-10-CM

## 2013-07-01 DIAGNOSIS — Z3480 Encounter for supervision of other normal pregnancy, unspecified trimester: Secondary | ICD-10-CM

## 2013-07-01 NOTE — Patient Instructions (Addendum)
Remember 1:5:1

## 2013-07-01 NOTE — Progress Notes (Signed)
Pt is a 27 y.o. [id number] [redacted]w[redacted]d by [place]  who presents today for routine prenatal care.  Seen in MAU X1 for pre-term contractions.  Otherwise has been doing well.  Back pain significantly improved following OMT last month. Taking Prenatal No concerns Discussed Circumcision - pt to call insurance VBAC Consent signed at [facility] >Cultures and exam in 1 week.

## 2013-07-07 ENCOUNTER — Ambulatory Visit (INDEPENDENT_AMBULATORY_CARE_PROVIDER_SITE_OTHER): Payer: 59 | Admitting: Sports Medicine

## 2013-07-07 ENCOUNTER — Other Ambulatory Visit (HOSPITAL_COMMUNITY)
Admission: RE | Admit: 2013-07-07 | Discharge: 2013-07-07 | Disposition: A | Discharge status: Home / Self Care | Payer: 59 | Source: Ambulatory Visit | Attending: Sports Medicine | Admitting: Sports Medicine

## 2013-07-07 VITALS — BP 127/83 | Temp 98.0°F | Wt 145.3 lb

## 2013-07-07 DIAGNOSIS — Z3480 Encounter for supervision of other normal pregnancy, unspecified trimester: Secondary | ICD-10-CM

## 2013-07-07 DIAGNOSIS — Z3483 Encounter for supervision of other normal pregnancy, third trimester: Secondary | ICD-10-CM

## 2013-07-07 DIAGNOSIS — Z113 Encounter for screening for infections with a predominantly sexual mode of transmission: Secondary | ICD-10-CM | POA: Insufficient documentation

## 2013-07-07 NOTE — Patient Instructions (Addendum)
It was nice to see you today.   Today we discussed: 1. Supervision of normal subsequent pregnancy, third trimester We collected labs today - Cervicovaginal ancillary only - Strep B DNA probe   Please plan to return to see Dr. Mauricio Po in 1 week.  If you need anything prior to seeing me please call the clinic.  Please Bring all medications with you to each appointment.

## 2013-07-07 NOTE — Progress Notes (Signed)
Pt is a 27 y.o. [id number] [redacted]w[redacted]d by [place] who presents today for routine prenatal care GBS and GC/Chlam collected today Confirmed head down with [place] today Cervix Closed on Speculum exam Having some reflux symptoms - Tums/OTC Pepcid Still needs to follow up with insurance F/u 1 week in OB clinic > routine care

## 2013-07-09 LAB — STREP B DNA PROBE: GBSP: NEGATIVE

## 2013-07-15 ENCOUNTER — Ambulatory Visit (INDEPENDENT_AMBULATORY_CARE_PROVIDER_SITE_OTHER): Payer: 59 | Admitting: Family Medicine

## 2013-07-15 VITALS — BP 110/61 | Temp 98.6°F | Wt 144.0 lb

## 2013-07-15 DIAGNOSIS — Z3480 Encounter for supervision of other normal pregnancy, unspecified trimester: Secondary | ICD-10-CM

## 2013-07-15 DIAGNOSIS — Z3483 Encounter for supervision of other normal pregnancy, third trimester: Secondary | ICD-10-CM

## 2013-07-15 NOTE — Patient Instructions (Signed)
Come back in 1 week to see Dr. Berline Chough  Vaginal Birth After Cesarean Delivery Vaginal birth after Cesarean delivery (VBAC) is giving birth vaginally after previously delivering a baby by a cesarean. In the past, if a woman had a Cesarean delivery, all births afterwards would be done by Cesarean delivery. This is no longer true. It can be safe for the mother to try a vaginal delivery after having a Cesarean. The final decision to have a VBAC or repeat Cesarean delivery should be between the patient and her caregiver. The risks and benefits can be discussed relative to the reason for, and the type of the previous Cesarean delivery. WOMEN WHO PLAN TO HAVE A VBAC SHOULD CHECK WITH THEIR DOCTOR TO BE SURE THAT:  The previous Cesarean was done with a low transverse uterine incision (not a vertical classical incision).  The birth canal is big enough for the baby.  There were no other operations on the uterus.  They will have an electronic fetal monitor (EFM) on at all times during labor.  An operating room would be available and ready in case an emergency Cesarean is needed.  A doctor and surgical nursing staff would be available at all times during labor to be ready to do an emergency Cesarean if necessary.  An anesthesiologist would be present in case an emergency Cesarean is needed.  The nursery is prepared and has adequate personnel and necessary equipment available to care for the baby in case of an emergency Cesarean. BENEFITS OF VBAC:  Shorter stay in the hospital.  Lower delivery, nursery and hospital costs.  Less blood loss and need for blood transfusions.  Less fever and discomfort from major surgery.  Lower risk of blood clots.  Lower risk of infection.  Shorter recovery after going home.  Lower risk of other surgical complications, such as opening of the incision or hernia in the incision.  Decreased risk of injury to other organs.  Decreased risk for having to remove the  uterus (hysterectomy).  Decreased risk for the placenta to completely or partially cover the opening of the uterus (placenta previa) with a future pregnancy.  Ability to have a larger family if desired. RISKS OF A VBAC:  Rupture of the uterus.  Having to remove the uterus (hysterectomy) if it ruptures.  All the complications of major surgery and/or injury to other organs.  Excessive bleeding, blood clots and infection.  Lower Apgar scores (method to evaluate the newborn based on appearance, pulse, grimace, activity, and respiration) and more risks to the baby.  There is a higher risk of uterine rupture if you induce or augment labor.  There is a higher risk of uterine rupture if you use medications to ripen the cervix. VBAC SHOULD NOT BE DONE IF:  The previous Cesarean was done with a vertical (classical) or T-shaped incision, or you do not know what kind of an incision was made.  You had a ruptured uterus.  You had surgery on your uterus.  You have medical or obstetrical problems.  There are problems with the baby.  There were two previous Cesarean deliveries and no vaginal deliveries. OTHER FACTS TO KNOW ABOUT VBAC:  It is safe to have an epidural anesthetic with VBAC.  It is safe to turn the baby from a breech position (attempt an external cephalic version).  It is safe to try a VBAC with twins.  Pregnancies later than 40 weeks have not been successful with VBAC.  There is an increased failure rate  of a VBAC in obese pregnant women.  There is an increased failure rate with VABC if the baby weighs 8.8 pounds (4000 grams) or more.  There is an increased failure rate if the time between the Cesarean and VBAC is less than 19 months.  There is an increased failure rate if pre-eclampsia is present (high blood pressure, protein in the urine and swelling of face and extremities).  VBAC is very successful if there was a previous vaginal birth.  VBAC is very successful  when the labor starts spontaneously before the due date.  Delivery of VBAC is similar to having a normal spontaneous vaginal delivery. It is important to discuss VBAC with your caregiver early in the pregnancy so you can understand the risks, benefits and options. It will give you time to decide what is best in your particular case relevant to the reason for your previous Cesarean delivery. It should be understood that medical changes in the mother or pregnancy may occur during the pregnancy, which make it necessary to change you or your caregiver's initial decision. The counseling, concerns and decisions should be documented in the medical record and signed by all parties. Document Released: 06/01/2007 Document Revised: 03/02/2012 Document Reviewed: 01/20/2009 [facility] Patient Information 2014 [place], [place].

## 2013-07-15 NOTE — Progress Notes (Signed)
FMTS Attending Admission Note: Drystan Reader,MD I  have seen and examined this patient, reviewed their chart. I have discussed this patient with the resident. I agree with the resident's findings, assessment and care plan.  

## 2013-07-15 NOTE — Progress Notes (Signed)
Morgan Todd is a 27 y.o. Z6X0960 at [redacted]w[redacted]d for routine follow up.  She reports no problems.   See flow sheet for details.  A/P: Pregnancy at [redacted]w[redacted]d.  Doing well.   Infant feeding choice Breast Infant circumcision desired yes GBS/GC/CZ results were reviewed today.   Labor precautions reviewed. Kick counts reviewed. Follow up 1 week With Dr. Berline Chough Pt still considering elective repeat CS vs VBAC

## 2013-07-23 ENCOUNTER — Ambulatory Visit (INDEPENDENT_AMBULATORY_CARE_PROVIDER_SITE_OTHER): Payer: 59 | Admitting: Sports Medicine

## 2013-07-23 VITALS — BP 100/70 | Temp 98.3°F | Wt 142.0 lb

## 2013-07-23 DIAGNOSIS — Z3483 Encounter for supervision of other normal pregnancy, third trimester: Secondary | ICD-10-CM

## 2013-07-23 DIAGNOSIS — Z3480 Encounter for supervision of other normal pregnancy, unspecified trimester: Secondary | ICD-10-CM

## 2013-07-23 NOTE — Patient Instructions (Addendum)

## 2013-07-23 NOTE — Progress Notes (Signed)
Pt is a 27 y.o. G4W1027 [redacted]w[redacted]d by [place] who presents today for routine care. See flow sheet Expectant management; reviewed S/Sx for eval at [facility] Return to care @ 40weeks

## 2013-07-28 ENCOUNTER — Ambulatory Visit (INDEPENDENT_AMBULATORY_CARE_PROVIDER_SITE_OTHER): Payer: 59 | Admitting: Sports Medicine

## 2013-07-28 VITALS — BP 122/81 | Temp 98.1°F | Wt 139.0 lb

## 2013-07-28 DIAGNOSIS — Z3480 Encounter for supervision of other normal pregnancy, unspecified trimester: Secondary | ICD-10-CM

## 2013-07-28 DIAGNOSIS — Z3483 Encounter for supervision of other normal pregnancy, third trimester: Secondary | ICD-10-CM

## 2013-07-28 NOTE — Progress Notes (Signed)
Pt. Is a 27 year old Z6X0960 [redacted]w[redacted]d who presents today for routine care.  See flow sheet.  Only complaint is of some left foot toe discomfort last night, resolved on its own.  BPP and NST 2x/wk from wk 40-41. Will schedule this for this week.  Return at 41 wks. Report to hospital if gush of fluid from vagina and/or strong contractions apart or less.

## 2013-07-28 NOTE — Patient Instructions (Addendum)
We will call you with your appointment for your stress tests. Your induction is at 730PM on Tuesday.  Please come to the MAU at that time and the OB team will start your induction process.

## 2013-07-28 NOTE — Progress Notes (Signed)
Pt is a 27 y.o. [id number] [redacted]w[redacted]d by [place] who presents today for routine care.  I agree with MSIV Judkins Note. Pt scheduled for BPP/NST and induction @ 41 weeks Reviewed s/sx of labor Expectant management TOLAC with induction scheduled, hx of failure of descent

## 2013-07-30 ENCOUNTER — Telehealth (HOSPITAL_COMMUNITY): Payer: Self-pay | Admitting: *Deleted

## 2013-07-30 ENCOUNTER — Ambulatory Visit (INDEPENDENT_AMBULATORY_CARE_PROVIDER_SITE_OTHER): Payer: 59 | Admitting: *Deleted

## 2013-07-30 ENCOUNTER — Encounter (HOSPITAL_COMMUNITY): Payer: Self-pay | Admitting: *Deleted

## 2013-07-30 VITALS — BP 135/81

## 2013-07-30 DIAGNOSIS — Z3483 Encounter for supervision of other normal pregnancy, third trimester: Secondary | ICD-10-CM

## 2013-07-30 DIAGNOSIS — O480 Post-term pregnancy: Secondary | ICD-10-CM

## 2013-07-30 NOTE — Telephone Encounter (Signed)
Preadmission screen  

## 2013-07-30 NOTE — Progress Notes (Signed)
P = 62  NST/AFI results reported to Dr. Jolayne Panther.   Pt is scheduled for IOL on 8/12.

## 2013-08-02 NOTE — Progress Notes (Signed)
8/8 NST reviewed and reactive 

## 2013-08-03 ENCOUNTER — Inpatient Hospital Stay (HOSPITAL_COMMUNITY): Admission: RE | Admit: 2013-08-03 | Payer: No Typology Code available for payment source | Source: Ambulatory Visit

## 2013-08-03 ENCOUNTER — Inpatient Hospital Stay (HOSPITAL_COMMUNITY)
Admission: AD | Admit: 2013-08-03 | Discharge: 2013-08-07 | DRG: 766 | Disposition: A | Discharge status: Home / Self Care | Payer: 59 | Source: Ambulatory Visit | Attending: Obstetrics & Gynecology | Admitting: Obstetrics & Gynecology

## 2013-08-03 ENCOUNTER — Encounter (HOSPITAL_COMMUNITY): Payer: Self-pay | Admitting: Anesthesiology

## 2013-08-03 ENCOUNTER — Encounter (HOSPITAL_COMMUNITY): Payer: Self-pay | Admitting: *Deleted

## 2013-08-03 ENCOUNTER — Inpatient Hospital Stay (HOSPITAL_COMMUNITY): Payer: 59 | Admitting: Anesthesiology

## 2013-08-03 DIAGNOSIS — Z3490 Encounter for supervision of normal pregnancy, unspecified, unspecified trimester: Secondary | ICD-10-CM

## 2013-08-03 DIAGNOSIS — O480 Post-term pregnancy: Principal | ICD-10-CM | POA: Diagnosis present

## 2013-08-03 DIAGNOSIS — O99013 Anemia complicating pregnancy, third trimester: Secondary | ICD-10-CM

## 2013-08-03 DIAGNOSIS — Z3480 Encounter for supervision of other normal pregnancy, unspecified trimester: Secondary | ICD-10-CM

## 2013-08-03 DIAGNOSIS — Z3483 Encounter for supervision of other normal pregnancy, third trimester: Secondary | ICD-10-CM

## 2013-08-03 DIAGNOSIS — B373 Candidiasis of vulva and vagina: Secondary | ICD-10-CM

## 2013-08-03 DIAGNOSIS — O9A212 Injury, poisoning and certain other consequences of external causes complicating pregnancy, second trimester: Secondary | ICD-10-CM

## 2013-08-03 DIAGNOSIS — B9689 Other specified bacterial agents as the cause of diseases classified elsewhere: Secondary | ICD-10-CM

## 2013-08-03 DIAGNOSIS — M9905 Segmental and somatic dysfunction of pelvic region: Secondary | ICD-10-CM

## 2013-08-03 DIAGNOSIS — O34219 Maternal care for unspecified type scar from previous cesarean delivery: Secondary | ICD-10-CM | POA: Diagnosis present

## 2013-08-03 DIAGNOSIS — Z3493 Encounter for supervision of normal pregnancy, unspecified, third trimester: Secondary | ICD-10-CM

## 2013-08-03 DIAGNOSIS — M549 Dorsalgia, unspecified: Secondary | ICD-10-CM

## 2013-08-03 LAB — CBC
Hemoglobin: 11.3 g/dL — ABNORMAL LOW (ref 12.0–15.0)
MCH: 30.3 pg (ref 26.0–34.0)
MCHC: 35.4 g/dL (ref 30.0–36.0)
MCV: 85.8 fL (ref 78.0–100.0)
Platelets: 191 10*3/uL (ref 150–400)
Platelets: 199 10*3/uL (ref 150–400)
RDW: 12.5 % (ref 11.5–15.5)
WBC: 7.5 10*3/uL (ref 4.0–10.5)

## 2013-08-03 LAB — TYPE AND SCREEN
ABO/RH(D): A POS
Antibody Screen: NEGATIVE

## 2013-08-03 LAB — RPR: RPR Ser Ql: NONREACTIVE

## 2013-08-03 LAB — ABO/RH: ABO/RH(D): A POS

## 2013-08-03 MED ORDER — CITRIC ACID-SODIUM CITRATE 334-500 MG/5ML PO SOLN
30.0000 mL | ORAL | Status: DC | PRN
  Administered 2013-08-03 – 2013-08-04 (×2): 30 mL via ORAL
  Filled 2013-08-03 (×2): qty 15

## 2013-08-03 MED ORDER — PHENYLEPHRINE 40 MCG/ML (10ML) SYRINGE FOR IV PUSH (FOR BLOOD PRESSURE SUPPORT): 80.0000 ug | PREFILLED_SYRINGE | INTRAVENOUS | Status: DC | PRN

## 2013-08-03 MED ORDER — DIPHENHYDRAMINE HCL 50 MG/ML IJ SOLN: 12.5000 mg | INTRAMUSCULAR | Status: DC | PRN

## 2013-08-03 MED ORDER — FLUTICASONE PROPIONATE HFA 44 MCG/ACT IN AERO
2.0000 | INHALATION_SPRAY | Freq: Two times a day (BID) | RESPIRATORY_TRACT | Status: DC
  Filled 2013-08-03: qty 10.6

## 2013-08-03 MED ORDER — ACETAMINOPHEN 325 MG PO TABS: 650.0000 mg | ORAL_TABLET | ORAL | Status: DC | PRN

## 2013-08-03 MED ORDER — LIDOCAINE HCL (PF) 1 % IJ SOLN
INTRAMUSCULAR | Status: DC | PRN
  Administered 2013-08-03: 4 mL
  Administered 2013-08-03: 3 mL

## 2013-08-03 MED ORDER — EPHEDRINE 5 MG/ML INJ: 10.0000 mg | INTRAVENOUS | Status: DC | PRN

## 2013-08-03 MED ORDER — PHENYLEPHRINE 40 MCG/ML (10ML) SYRINGE FOR IV PUSH (FOR BLOOD PRESSURE SUPPORT)
80.0000 ug | PREFILLED_SYRINGE | INTRAVENOUS | Status: DC | PRN
  Filled 2013-08-03: qty 5

## 2013-08-03 MED ORDER — OXYTOCIN 40 UNITS IN LACTATED RINGERS INFUSION - SIMPLE MED
62.5000 mL/h | INTRAVENOUS | Status: DC
  Filled 2013-08-03: qty 1000

## 2013-08-03 MED ORDER — FENTANYL CITRATE 0.05 MG/ML IJ SOLN
100.0000 ug | INTRAMUSCULAR | Status: DC | PRN
  Administered 2013-08-03 (×4): 100 ug via INTRAVENOUS
  Filled 2013-08-03 (×5): qty 2

## 2013-08-03 MED ORDER — LACTATED RINGERS IV SOLN
INTRAVENOUS | Status: DC
  Administered 2013-08-03 – 2013-08-04 (×6): via INTRAVENOUS

## 2013-08-03 MED ORDER — IBUPROFEN 600 MG PO TABS: 600.0000 mg | ORAL_TABLET | Freq: Four times a day (QID) | ORAL | Status: DC | PRN

## 2013-08-03 MED ORDER — FENTANYL 2.5 MCG/ML BUPIVACAINE 1/10 % EPIDURAL INFUSION (WH - ANES)
14.0000 mL/h | INTRAMUSCULAR | Status: DC | PRN
  Administered 2013-08-04: 14 mL/h via EPIDURAL
  Filled 2013-08-03 (×3): qty 125

## 2013-08-03 MED ORDER — FENTANYL 2.5 MCG/ML BUPIVACAINE 1/10 % EPIDURAL INFUSION (WH - ANES)
INTRAMUSCULAR | Status: DC | PRN
  Administered 2013-08-03: 12 mL/h via EPIDURAL

## 2013-08-03 MED ORDER — EPHEDRINE 5 MG/ML INJ
10.0000 mg | INTRAVENOUS | Status: DC | PRN
  Filled 2013-08-03: qty 4

## 2013-08-03 MED ORDER — ONDANSETRON HCL 4 MG/2ML IJ SOLN
4.0000 mg | Freq: Four times a day (QID) | INTRAMUSCULAR | Status: DC | PRN
  Administered 2013-08-03: 4 mg via INTRAVENOUS
  Filled 2013-08-03 (×2): qty 2

## 2013-08-03 MED ORDER — LACTATED RINGERS IV SOLN: 500.0000 mL | INTRAVENOUS | Status: DC | PRN

## 2013-08-03 MED ORDER — LIDOCAINE HCL (PF) 1 % IJ SOLN: 30.0000 mL | INTRAMUSCULAR | Status: DC | PRN

## 2013-08-03 MED ORDER — OXYTOCIN BOLUS FROM INFUSION: 500.0000 mL | INTRAVENOUS | Status: DC

## 2013-08-03 MED ORDER — OXYCODONE-ACETAMINOPHEN 5-325 MG PO TABS: 1.0000 | ORAL_TABLET | ORAL | Status: DC | PRN

## 2013-08-03 MED ORDER — LACTATED RINGERS IV SOLN
500.0000 mL | Freq: Once | INTRAVENOUS | Status: AC
  Administered 2013-08-03: 500 mL via INTRAVENOUS

## 2013-08-03 MED ORDER — ALBUTEROL SULFATE HFA 108 (90 BASE) MCG/ACT IN AERS
1.0000 | INHALATION_SPRAY | RESPIRATORY_TRACT | Status: DC | PRN
  Filled 2013-08-03: qty 6.7

## 2013-08-03 NOTE — H&P (Cosign Needed)
Morgan Todd is a 27 y.o. female [phone] at 21w GA with a planned IOL-VBAC tonight presenting with painful, regular contractions. Maternal Medical History:  Reason for admission: Contractions and nausea.   Contractions: Onset was 13-24 hours ago.   Frequency: regular.   Duration is approximately 10 minutes.   Perceived severity is moderate.    Fetal activity: Perceived fetal activity is normal.   Last perceived fetal movement was within the past hour.    Prenatal complications: No bleeding, infection or pre-eclampsia.   Prenatal Complications - Diabetes: none.    OB History   Grav Para Term Preterm Abortions TAB SAB Ect Mult Living   4 1 1  2  0    1     Past Medical History  Diagnosis Date  . 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 Abnormal Pap smear   . BARTHOLIN'S CYST ABSCESS 06/07/2010    Qualifier: Diagnosis of  By: Benjamin Stain MD, Maisie Fus    . ABNORMAL PAP SMEAR, LGSIL 01/28/2011    In 2012   . Sickle cell trait   . Asthma     prn inhaler   Past Surgical History  Procedure Laterality Date  . Cesarean section  10/30/2009    Failure to Progress  . Tooth extraction     Family History: family history includes Cervical cancer in her maternal grandmother; Multiple sclerosis in her father; Schizophrenia in her maternal uncle; and Sickle cell anemia in her maternal grandfather and mother. Social History:  reports that she quit smoking about 9 months ago. She has never used smokeless tobacco. She reports that she does not drink alcohol or use illicit drugs.   Prenatal Transfer Tool  Maternal Diabetes: No Genetic Screening: Normal Maternal Ultrasounds/Referrals: Normal Fetal Ultrasounds or other Referrals:  None Maternal Substance Abuse:  No Significant Maternal Medications:  Meds include: Other: beclamethasone (QVAR) - inhaler Significant Maternal Lab Results:  None Other Comments:  plans to breastfeed, considering  in-house circumcision and nexplanon or father vasectomy for birth control.  Review of Systems  Constitutional: Negative for fever.  Eyes:       Negative for vision changes.  Cardiovascular: Negative for chest pain, palpitations, orthopnea and leg swelling.  Gastrointestinal: Positive for nausea. Negative for vomiting, abdominal pain, diarrhea and constipation.  Neurological: Negative for headaches.    Dilation: 2 Effacement (%): 50 Station: -2 Exam by:: Dorrene German RN Blood pressure 124/85, pulse 74, temperature 97 F (36.1 C), temperature source Oral, resp. rate 20, last menstrual period 09/25/2012. Maternal Exam:  Uterine Assessment: Contraction strength is firm.  Contraction duration is 2 minutes. Contraction frequency is regular.   Abdomen: Patient reports generalized tenderness.  Estimated fetal weight is 8 lbs.   Fetal presentation: vertex  Introitus: Normal vulva. Normal vagina.  Ferning test: not done.  Nitrazine test: not done. Amniotic fluid character: not assessed.  Pelvis: adequate for delivery.   Cervix: Cervix evaluated by digital exam.     Fetal Exam Fetal Monitor Review: Mode: ultrasound.   Baseline rate: 125.  Variability: moderate (6-25 bpm).   Pattern: accelerations present and no decelerations.    Fetal State Assessment: Category I - tracings are normal.     Physical Exam  Constitutional: She is oriented to person, place, and time. She appears well-developed and well-nourished.  Appears in pain with contractions.  HENT:  Head: Normocephalic and atraumatic.  Eyes: EOM are normal.  Cardiovascular: Normal rate, regular rhythm, normal heart  sounds and intact distal pulses.  Exam reveals no gallop and no friction rub.   No murmur heard. Respiratory: Effort normal and breath sounds normal.  GI: There is generalized tenderness.  Gravid appropriate  Neurological: She is alert and oriented to person, place, and time.  Skin: Skin is warm and dry.   Psychiatric: She has a normal mood and affect. Her behavior is normal. Judgment and thought content normal.     vtx by [place]    Prenatal labs: ABO, Rh: A/POS/-- (12/04 1413) Antibody: NEG (12/04 1413) Rubella: 26.10 (12/04 1413) RPR: NON REAC (05/08 1118)  HBsAg: NEGATIVE (12/04 1413)  HIV: NON REACTIVE (05/08 1118)  GBS: NEGATIVE (07/16 1148)   Assessment/Plan: W1X9147 at 41w GA with history of dilation arrest at 7-8 cm and a planned IOL-VBAC tonight, will admit for labor observation and pain management until induction tonight.  Anterior placenta noted on U/S.  Garnette Czech 08/03/2013, 11:26 AM  Morgan Todd is a 27 y.o. G4P1021 at [redacted]w[redacted]d here with painful ctx.no change as of yet, but will start induction now. LaborAdmitPlan #Labor: IOL/VBAC - place foley bulb now, reviewed VBAC consent. Signed. #Pain: IV/Epidural #FWB: Cat I #ID: GBS NEG, #MOF: Breast #MOC: Nexplanon #Circ: Deciding based on copay.  Reviewed risks and benefits of TOLAC. Pt continues to want to trial. Consent confirmed in media tab.  I spoke with and examined patient and agree with PA-S's note and plan of care.  Tawana Scale, MD Ob Fellow 08/03/2013 12:54 PM

## 2013-08-03 NOTE — MAU Note (Signed)
Contractions started during the night, ? How often, but are getting closer and stronger. No bleeding or leaking. Plans for VBAC. Was 3 cm 2 wks ago.

## 2013-08-03 NOTE — Anesthesia Preprocedure Evaluation (Signed)
Anesthesia Evaluation  Patient identified by MRN, date of birth, ID band Patient awake    Reviewed: Allergy & Precautions, H&P , Patient's Chart, lab work & pertinent test results  Airway Mallampati: III TM Distance: >3 FB Neck ROM: Full    Dental no notable dental hx. (+) Teeth Intact   Pulmonary asthma , former smoker,    Pulmonary exam normal       Cardiovascular negative cardio ROS  Rhythm:Regular Rate:Normal     Neuro/Psych negative neurological ROS  negative psych ROS   GI/Hepatic Neg liver ROS, GERD-  Controlled and Medicated,  Endo/Other  negative endocrine ROS  Renal/GU negative Renal ROS  negative genitourinary   Musculoskeletal   Abdominal (+) + obese,   Peds  Hematology  (+) Blood dyscrasia, Sickle cell trait and anemia ,   Anesthesia Other Findings   Reproductive/Obstetrics                           Anesthesia Physical Anesthesia Plan  ASA: II  Anesthesia Plan: Epidural   Post-op Pain Management:    Induction:   Airway Management Planned: Natural Airway  Additional Equipment:   Intra-op Plan:   Post-operative Plan:   Informed Consent: I have reviewed the patients History and Physical, chart, labs and discussed the procedure including the risks, benefits and alternatives for the proposed anesthesia with the patient or authorized representative who has indicated his/her understanding and acceptance.     Plan Discussed with: Anesthesiologist and CRNA  Anesthesia Plan Comments:         Anesthesia Quick Evaluation

## 2013-08-03 NOTE — Anesthesia Procedure Notes (Signed)
Epidural Patient location during procedure: OB Start time: 08/03/2013 8:54 PM  Staffing Anesthesiologist: Junetta Hearn A. Performed by: anesthesiologist   Preanesthetic Checklist Completed: patient identified, site marked, surgical consent, pre-op evaluation, timeout performed, IV checked, risks and benefits discussed and monitors and equipment checked  Epidural Patient position: sitting Prep: site prepped and draped and DuraPrep Patient monitoring: continuous pulse ox and blood pressure Approach: midline Injection technique: LOR air  Needle:  Needle type: Tuohy  Needle gauge: 17 G Needle length: 9 cm and 9 Needle insertion depth: 4 cm Catheter type: closed end flexible Catheter size: 19 Gauge Catheter at skin depth: 9 cm Test dose: negative and Other  Assessment Events: blood not aspirated, injection not painful, no injection resistance, negative IV test and no paresthesia  Additional Notes Patient identified. Risks and benefits discussed including failed block, incomplete  Pain control, post dural puncture headache, nerve damage, paralysis, blood pressure Changes, nausea, vomiting, reactions to medications-both toxic and allergic and post Partum back pain. All questions were answered. Patient expressed understanding and wished to proceed. Sterile technique was used throughout procedure. Epidural site was Dressed with sterile barrier dressing. No paresthesias, signs of intravascular injection Or signs of intrathecal spread were encountered.  Patient was more comfortable after the epidural was dosed. Please see RN's note for documentation of vital signs and FHR which are stable.

## 2013-08-03 NOTE — Progress Notes (Signed)
Morgan Todd is a 27 y.o. Z6X0960 at [redacted]w[redacted]d by LMP admitted for active labor, schedule IOL for today.  Subjective: Pt resting comfortably.  +N&V with fentanyl  Objective: BP 128/75  Pulse 75  Temp(Src) 98.3 F (36.8 C) (Oral)  Resp 16  Ht 4\' 11"  (1.499 m)  Wt 67.586 kg (149 lb)  BMI 30.08 kg/m2  LMP 09/25/2012      FHT:  FHR: 130 bpm, variability: moderate,  accelerations:  Present,  decelerations:  Absent UC:   regular, every 3-5 minutes SVE:   Dilation: 2 Effacement (%): 50 Station: -2 Exam by:: dr. [name] Foley Bulb in place  Labs: Lab Results  Component Value Date   WBC 7.5 08/03/2013   HGB 11.1* 08/03/2013   HCT 31.4* 08/03/2013   MCV 85.8 08/03/2013   PLT 191 08/03/2013    Assessment / Plan: Induction of labor due to postterm,  progressing well on pitocin  Labor: Progressing normally Preeclampsia:   Fetal Wellbeing:  Category I Pain Control:  Fentanyl I/D:  n/a Anticipated MOD:  TOLAC NSVD  Dr. [name] for active management; please call/page me for labor  [name], DO [facility] Resident - PGY-2 08/03/2013 7:32 PM

## 2013-08-04 ENCOUNTER — Encounter (HOSPITAL_COMMUNITY)
Admission: AD | Disposition: A | Discharge status: Home / Self Care | Payer: Self-pay | Source: Ambulatory Visit | Attending: Obstetrics & Gynecology

## 2013-08-04 ENCOUNTER — Encounter (HOSPITAL_COMMUNITY): Payer: Self-pay | Admitting: Registered Nurse

## 2013-08-04 DIAGNOSIS — O34219 Maternal care for unspecified type scar from previous cesarean delivery: Secondary | ICD-10-CM

## 2013-08-04 DIAGNOSIS — O480 Post-term pregnancy: Secondary | ICD-10-CM

## 2013-08-04 SURGERY — Surgical Case
Anesthesia: Epidural | Site: Abdomen | Wound class: Clean Contaminated

## 2013-08-04 MED ORDER — DIPHENHYDRAMINE HCL 50 MG/ML IJ SOLN: 25.0000 mg | INTRAMUSCULAR | Status: DC | PRN

## 2013-08-04 MED ORDER — OXYTOCIN 10 UNIT/ML IJ SOLN
40.0000 [IU] | INTRAVENOUS | Status: DC | PRN
  Administered 2013-08-04: 40 [IU] via INTRAVENOUS

## 2013-08-04 MED ORDER — SCOPOLAMINE 1 MG/3DAYS TD PT72: 1.0000 | MEDICATED_PATCH | Freq: Once | TRANSDERMAL | Status: DC

## 2013-08-04 MED ORDER — MORPHINE SULFATE 0.5 MG/ML IJ SOLN
INTRAMUSCULAR | Status: AC
  Filled 2013-08-04: qty 10

## 2013-08-04 MED ORDER — IBUPROFEN 600 MG PO TABS: 600.0000 mg | ORAL_TABLET | Freq: Four times a day (QID) | ORAL | Status: DC

## 2013-08-04 MED ORDER — PRENATAL MULTIVITAMIN CH
1.0000 | ORAL_TABLET | Freq: Every day | ORAL | Status: DC
  Administered 2013-08-06: 1 via ORAL

## 2013-08-04 MED ORDER — DIBUCAINE 1 % RE OINT: 1.0000 "application " | TOPICAL_OINTMENT | RECTAL | Status: DC | PRN

## 2013-08-04 MED ORDER — BUPIVACAINE LIPOSOME 1.3 % IJ SUSP
20.0000 mL | Freq: Once | INTRAMUSCULAR | Status: AC
  Administered 2013-08-04: 60 mL
  Filled 2013-08-04: qty 20

## 2013-08-04 MED ORDER — ONDANSETRON HCL 4 MG PO TABS: 4.0000 mg | ORAL_TABLET | ORAL | Status: DC | PRN

## 2013-08-04 MED ORDER — KETOROLAC TROMETHAMINE 30 MG/ML IJ SOLN: 30.0000 mg | Freq: Four times a day (QID) | INTRAMUSCULAR | Status: AC | PRN

## 2013-08-04 MED ORDER — DIPHENHYDRAMINE HCL 25 MG PO CAPS: 25.0000 mg | ORAL_CAPSULE | Freq: Four times a day (QID) | ORAL | Status: DC | PRN

## 2013-08-04 MED ORDER — DIPHENHYDRAMINE HCL 50 MG/ML IJ SOLN: 12.5000 mg | INTRAMUSCULAR | Status: DC | PRN

## 2013-08-04 MED ORDER — OXYCODONE-ACETAMINOPHEN 5-325 MG PO TABS
1.0000 | ORAL_TABLET | ORAL | Status: DC | PRN
  Administered 2013-08-05 – 2013-08-06 (×2): 1 via ORAL
  Administered 2013-08-06: 2 via ORAL
  Administered 2013-08-06: 1 via ORAL
  Administered 2013-08-06: 2 via ORAL
  Administered 2013-08-06: 1 via ORAL
  Administered 2013-08-07 (×3): 2 via ORAL
  Filled 2013-08-04 (×2): qty 2
  Filled 2013-08-04 (×3): qty 1
  Filled 2013-08-04: qty 2
  Filled 2013-08-04 (×3): qty 1
  Filled 2013-08-04: qty 2

## 2013-08-04 MED ORDER — FENTANYL CITRATE 0.05 MG/ML IJ SOLN: 25.0000 ug | INTRAMUSCULAR | Status: DC | PRN

## 2013-08-04 MED ORDER — TERBUTALINE SULFATE 1 MG/ML IJ SOLN: 0.2500 mg | Freq: Once | INTRAMUSCULAR | Status: DC | PRN

## 2013-08-04 MED ORDER — NALBUPHINE HCL 10 MG/ML IJ SOLN
5.0000 mg | INTRAMUSCULAR | Status: DC | PRN
  Filled 2013-08-04: qty 1

## 2013-08-04 MED ORDER — SODIUM BICARBONATE 8.4 % IV SOLN
INTRAVENOUS | Status: AC
  Filled 2013-08-04: qty 50

## 2013-08-04 MED ORDER — SODIUM BICARBONATE 8.4 % IV SOLN
INTRAVENOUS | Status: DC | PRN
  Administered 2013-08-04 (×3): 5 mL via EPIDURAL

## 2013-08-04 MED ORDER — CEFAZOLIN SODIUM-DEXTROSE 2-3 GM-% IV SOLR
INTRAVENOUS | Status: AC
  Filled 2013-08-04: qty 50

## 2013-08-04 MED ORDER — CEFAZOLIN SODIUM-DEXTROSE 2-3 GM-% IV SOLR
INTRAVENOUS | Status: DC | PRN
  Administered 2013-08-04: 2 g via INTRAVENOUS

## 2013-08-04 MED ORDER — LANOLIN HYDROUS EX OINT: 1.0000 "application " | TOPICAL_OINTMENT | CUTANEOUS | Status: DC | PRN

## 2013-08-04 MED ORDER — OXYTOCIN 40 UNITS IN LACTATED RINGERS INFUSION - SIMPLE MED
1.0000 m[IU]/min | INTRAVENOUS | Status: DC
  Administered 2013-08-04: 2 m[IU]/min via INTRAVENOUS

## 2013-08-04 MED ORDER — OXYTOCIN 10 UNIT/ML IJ SOLN
INTRAMUSCULAR | Status: AC
  Filled 2013-08-04: qty 4

## 2013-08-04 MED ORDER — SODIUM CHLORIDE 0.9 % IJ SOLN: 3.0000 mL | INTRAMUSCULAR | Status: DC | PRN

## 2013-08-04 MED ORDER — LACTATED RINGERS IV SOLN
INTRAVENOUS | Status: DC
  Administered 2013-08-05: 08:00:00 via INTRAVENOUS

## 2013-08-04 MED ORDER — ONDANSETRON HCL 4 MG/2ML IJ SOLN: 4.0000 mg | Freq: Three times a day (TID) | INTRAMUSCULAR | Status: DC | PRN

## 2013-08-04 MED ORDER — FENTANYL CITRATE 0.05 MG/ML IJ SOLN
INTRAMUSCULAR | Status: AC
  Filled 2013-08-04: qty 2

## 2013-08-04 MED ORDER — ONDANSETRON HCL 4 MG/2ML IJ SOLN
INTRAMUSCULAR | Status: DC | PRN
  Administered 2013-08-04: 4 mg via INTRAVENOUS

## 2013-08-04 MED ORDER — ZOLPIDEM TARTRATE 5 MG PO TABS: 5.0000 mg | ORAL_TABLET | Freq: Every evening | ORAL | Status: DC | PRN

## 2013-08-04 MED ORDER — OXYTOCIN 40 UNITS IN LACTATED RINGERS INFUSION - SIMPLE MED: 62.5000 mL/h | INTRAVENOUS | Status: AC

## 2013-08-04 MED ORDER — KETOROLAC TROMETHAMINE 60 MG/2ML IM SOLN
INTRAMUSCULAR | Status: AC
  Administered 2013-08-04: 60 mg via INTRAMUSCULAR
  Filled 2013-08-04: qty 2

## 2013-08-04 MED ORDER — ONDANSETRON HCL 4 MG/2ML IJ SOLN
4.0000 mg | INTRAMUSCULAR | Status: DC | PRN
  Administered 2013-08-04 – 2013-08-05 (×2): 4 mg via INTRAVENOUS
  Filled 2013-08-04 (×2): qty 2

## 2013-08-04 MED ORDER — LIDOCAINE-EPINEPHRINE (PF) 2 %-1:200000 IJ SOLN
INTRAMUSCULAR | Status: AC
  Filled 2013-08-04: qty 20

## 2013-08-04 MED ORDER — NALOXONE HCL 1 MG/ML IJ SOLN
1.0000 ug/kg/h | INTRAMUSCULAR | Status: DC | PRN
  Filled 2013-08-04: qty 2

## 2013-08-04 MED ORDER — MEPERIDINE HCL 25 MG/ML IJ SOLN: 6.2500 mg | INTRAMUSCULAR | Status: DC | PRN

## 2013-08-04 MED ORDER — KETOROLAC TROMETHAMINE 60 MG/2ML IM SOLN: 60.0000 mg | Freq: Once | INTRAMUSCULAR | Status: AC | PRN

## 2013-08-04 MED ORDER — METOCLOPRAMIDE HCL 5 MG/ML IJ SOLN
10.0000 mg | Freq: Three times a day (TID) | INTRAMUSCULAR | Status: DC | PRN
  Administered 2013-08-04: 10 mg via INTRAVENOUS
  Filled 2013-08-04: qty 2

## 2013-08-04 MED ORDER — ONDANSETRON HCL 4 MG/2ML IJ SOLN
INTRAMUSCULAR | Status: AC
  Filled 2013-08-04: qty 2

## 2013-08-04 MED ORDER — SENNOSIDES-DOCUSATE SODIUM 8.6-50 MG PO TABS
2.0000 | ORAL_TABLET | Freq: Every day | ORAL | Status: DC
  Administered 2013-08-05 – 2013-08-07 (×2): 2 via ORAL

## 2013-08-04 MED ORDER — TETANUS-DIPHTH-ACELL PERTUSSIS 5-2.5-18.5 LF-MCG/0.5 IM SUSP: 0.5000 mL | Freq: Once | INTRAMUSCULAR | Status: DC

## 2013-08-04 MED ORDER — FENTANYL CITRATE 0.05 MG/ML IJ SOLN
INTRAMUSCULAR | Status: DC | PRN
  Administered 2013-08-04: 100 ug via EPIDURAL

## 2013-08-04 MED ORDER — MEASLES, MUMPS & RUBELLA VAC ~~LOC~~ INJ: 0.5000 mL | INJECTION | Freq: Once | SUBCUTANEOUS | Status: DC

## 2013-08-04 MED ORDER — IBUPROFEN 600 MG PO TABS
600.0000 mg | ORAL_TABLET | Freq: Four times a day (QID) | ORAL | Status: DC
  Administered 2013-08-05 – 2013-08-07 (×8): 600 mg via ORAL
  Filled 2013-08-04 (×8): qty 1

## 2013-08-04 MED ORDER — MENTHOL 3 MG MT LOZG: 1.0000 | LOZENGE | OROMUCOSAL | Status: DC | PRN

## 2013-08-04 MED ORDER — MAGNESIUM HYDROXIDE 400 MG/5ML PO SUSP: 30.0000 mL | ORAL | Status: DC | PRN

## 2013-08-04 MED ORDER — NALOXONE HCL 0.4 MG/ML IJ SOLN: 0.4000 mg | INTRAMUSCULAR | Status: DC | PRN

## 2013-08-04 MED ORDER — SIMETHICONE 80 MG PO CHEW
80.0000 mg | CHEWABLE_TABLET | Freq: Three times a day (TID) | ORAL | Status: DC
  Administered 2013-08-05 – 2013-08-07 (×7): 80 mg via ORAL

## 2013-08-04 MED ORDER — DIPHENHYDRAMINE HCL 25 MG PO CAPS
25.0000 mg | ORAL_CAPSULE | ORAL | Status: DC | PRN
  Filled 2013-08-04: qty 1

## 2013-08-04 MED ORDER — MORPHINE SULFATE (PF) 0.5 MG/ML IJ SOLN
INTRAMUSCULAR | Status: DC | PRN
  Administered 2013-08-04: 3 mg via EPIDURAL

## 2013-08-04 MED ORDER — WITCH HAZEL-GLYCERIN EX PADS: 1.0000 "application " | MEDICATED_PAD | CUTANEOUS | Status: DC | PRN

## 2013-08-04 MED ORDER — SIMETHICONE 80 MG PO CHEW
80.0000 mg | CHEWABLE_TABLET | ORAL | Status: DC | PRN
  Administered 2013-08-06: 80 mg via ORAL

## 2013-08-04 MED ORDER — LACTATED RINGERS IV SOLN
INTRAVENOUS | Status: DC | PRN
  Administered 2013-08-04: 18:00:00 via INTRAVENOUS

## 2013-08-04 MED ORDER — KETOROLAC TROMETHAMINE 30 MG/ML IJ SOLN
30.0000 mg | Freq: Four times a day (QID) | INTRAMUSCULAR | Status: AC | PRN
  Administered 2013-08-05: 30 mg via INTRAVENOUS
  Filled 2013-08-04: qty 1

## 2013-08-04 SURGICAL SUPPLY — 36 items
CLAMP CORD UMBIL (MISCELLANEOUS) IMPLANT
CLOTH BEACON ORANGE TIMEOUT ST (SAFETY) ×2 IMPLANT
DERMABOND ADVANCED (GAUZE/BANDAGES/DRESSINGS) ×2
DERMABOND ADVANCED .7 DNX12 (GAUZE/BANDAGES/DRESSINGS) ×2 IMPLANT
DRAPE LG THREE QUARTER DISP (DRAPES) ×2 IMPLANT
DRSG OPSITE POSTOP 4X10 (GAUZE/BANDAGES/DRESSINGS) ×2 IMPLANT
DURAPREP 26ML APPLICATOR (WOUND CARE) ×4 IMPLANT
ELECT REM PT RETURN 9FT ADLT (ELECTROSURGICAL) ×2
ELECTRODE REM PT RTRN 9FT ADLT (ELECTROSURGICAL) ×1 IMPLANT
EXTRACTOR VACUUM BELL STYLE (SUCTIONS) IMPLANT
GLOVE BIOGEL PI IND STRL 8 (GLOVE) ×1 IMPLANT
GLOVE BIOGEL PI INDICATOR 8 (GLOVE) ×1
GLOVE ECLIPSE 8.0 STRL XLNG CF (GLOVE) ×2 IMPLANT
GOWN STRL REIN XL XLG (GOWN DISPOSABLE) ×4 IMPLANT
KIT ABG SYR 3ML LUER SLIP (SYRINGE) ×2 IMPLANT
NEEDLE HYPO 18GX1.5 BLUNT FILL (NEEDLE) IMPLANT
NEEDLE HYPO 22GX1.5 SAFETY (NEEDLE) ×2 IMPLANT
NEEDLE HYPO 25X5/8 SAFETYGLIDE (NEEDLE) ×2 IMPLANT
NS IRRIG 1000ML POUR BTL (IV SOLUTION) ×2 IMPLANT
PACK C SECTION WH (CUSTOM PROCEDURE TRAY) ×2 IMPLANT
PAD OB MATERNITY 4.3X12.25 (PERSONAL CARE ITEMS) ×2 IMPLANT
RTRCTR C-SECT PINK 25CM LRG (MISCELLANEOUS) IMPLANT
STAPLER VISISTAT 35W (STAPLE) IMPLANT
SUT CHROMIC 0 CT 1 (SUTURE) ×2 IMPLANT
SUT MNCRL 0 VIOLET CTX 36 (SUTURE) ×2 IMPLANT
SUT MONOCRYL 0 CTX 36 (SUTURE) ×2
SUT PLAIN 2 0 (SUTURE)
SUT PLAIN 2 0 XLH (SUTURE) IMPLANT
SUT PLAIN ABS 2-0 CT1 27XMFL (SUTURE) IMPLANT
SUT VIC AB 0 CTX 36 (SUTURE) ×1
SUT VIC AB 0 CTX36XBRD ANBCTRL (SUTURE) ×1 IMPLANT
SUT VIC AB 4-0 KS 27 (SUTURE) IMPLANT
SYR 20CC LL (SYRINGE) ×2 IMPLANT
TOWEL OR 17X24 6PK STRL BLUE (TOWEL DISPOSABLE) ×2 IMPLANT
TRAY FOLEY CATH 14FR (SET/KITS/TRAYS/PACK) IMPLANT
WATER STERILE IRR 1000ML POUR (IV SOLUTION) IMPLANT

## 2013-08-04 NOTE — Progress Notes (Signed)
Morgan Todd is a 27 y.o. [id number] at [redacted]w[redacted]d by LMP admitted for active labor, schedule IOL for today.  Subjective: Pt resting comfortably.  Considering elective repeat c-section; if continues to show progression wanting VBAC  Objective: BP 126/83  Pulse 61  Temp(Src) 98.4 F (36.9 C) (Oral)  Resp 16  Ht 4\' 11"  (1.499 m)  Wt 67.586 kg (149 lb)  BMI 30.08 kg/m2  SpO2 100%  LMP 09/25/2012 I/O last 3 completed shifts: In: -  Out: 1000 [Urine:1000]    FHT:  FHR: 155 bpm, variability: moderate,  accelerations:  Present,  decelerations:  Absent UC:   regular, every 3-5 minutes SVE:   Dilation: 5 Effacement (%): 90 Station: -1 Exam by:: Dr. Reola Calkins  IUPC and PITOCIN  Labs: Lab Results  Component Value Date   WBC 9.3 08/03/2013   HGB 11.3* 08/03/2013   HCT 31.9* 08/03/2013   MCV 85.5 08/03/2013   PLT 199 08/03/2013    Assessment / Plan: Induction of labor due to postterm,  progressing well on pitocin  Labor: Progressing slowly on PIT, now having adequate contractions Preeclampsia:   Fetal Wellbeing:  Category I Pain Control:  Fentanyl I/D:  n/a Anticipated MOD:  TOLAC NSVD  Plan to recheck for cervical change in 1 hour, if continues to show change pt agreeable to continue TOLAC.  Otherwise plan for C-section; discussed options for elective repeat c-section as well but wanting continue TOLAC.  Morgan Mews, DO [facility] Resident - PGY-2 08/04/2013 4:25 PM

## 2013-08-04 NOTE — Progress Notes (Signed)
Dr. Macon Large at bedside discussing plan of care for C/S related to arrest of labor and constant pain in RLQ at previous incision site. Patient and husband agreed to POC. Consent signed, prepped for c/s- sodium citrate, scds, CHG wipes, betadine not given related to patient complaint of reaction to shellfish in the past. Calls made to OR, and anesthesia.

## 2013-08-04 NOTE — Op Note (Signed)
Preoperative diagnosis:  1.  Intrauterine pregnancy at [redacted]w[redacted]d weeks gestation                                         2.  TOLAC                                         3.  Abdominal pain out of proportion to exam concerning for       possible uterine rupture   Postoperative diagnosis:  Same as above. Intact uterus.   Procedure:  Repeat low transverse cesarean section  Surgeon:  Lazaro Arms MD  Assistant:  Rulon Abide MD, Flo Shanks MD  Anesthesia: epidural dosed to spinal levels   Findings: Pt was a previous c/s for failure to dilate at 7cm.  Pt wanted a TOLAC and was induced.  She progressed to 5cm and at that time began complaining of severe lower abdominal pain that was not relieved with epidural.  She had also had failure of dilatation beyond 5cm for >12 hours with adequate contractions.     Over a low transverse incision was delivered a viable female with Apgars of 3, 8 and 9.  Uterus, tubes and ovaries were all normal. No signs of uterine rupture.  There were no other significant findings  Description of operation:  Patient was taken to the operating room and her epidural was dosed to spinal levels. She was then placed in the supine position with tilt to the left side. When adequate anesthetic level was obtained she was prepped and draped in usual sterile fashion and a Foley catheter was placed. A Pfannenstiel skin incision was made and carried down sharply to the rectus fascia which was scored in the midline extended laterally. The fascia was taken off the muscles both superiorly and without difficulty. The muscles were divided.  The peritoneal cavity was entered.  Bladder blade was placed, no bladder flap was created.  A low transverse hysterotomy incision was made and delivered a viable female  infant at 43 with Apgars of 3,8, and 9.  Weight to be determined.  Cord pH was obtained and was 7.30. The uterus was exteriorized. It was closed in 1 layer with a running interlocking layer using  0 monocryl on a CTX needle. There was good resulting hemostasis. The uterus was thin and attenuated. The tubes and ovaries were all normal. Peritoneal cavity was irrigated vigorously. The muscles and peritoneum were reapproximated loosely. The fascia was closed using 0 Vicryl in running fashion. Subcutaneous tissue was made hemostatic and irrigated. The skin was closed using 4-0 Vicryl on a Keith needle in a subcuticular fashion.  Dermabond was placed for additional wound integrity and to serve as a barrier. Blood loss for the procedure was 700 cc. The patient received a gram of Ancef prophylactically. The patient was taken to the recovery room in good stable condition with all counts being correct x3.  EBL 700 cc  Ashlei Chinchilla L, MD

## 2013-08-04 NOTE — Progress Notes (Signed)
Patient ID: Morgan Todd, female   DOB: 02-07-1986, 27 y.o.   MRN: [id number] LABOR PROGRESS NOTE  Morgan Todd is a 27 y.o. G4P1021 at 103w1d  admitted for post dates IOL  Subjective:  Pt comfortable with epidural. No lof, vb. +FM  Objective: BP 111/69  Pulse 67  Temp(Src) 98.2 F (36.8 C) (Oral)  Resp 18  Ht 4\' 11"  (1.499 m)  Wt 67.586 kg (149 lb)  BMI 30.08 kg/m2  SpO2 100%  LMP 09/25/2012    FHT:  FHR: 120 bpm, variability: moderate,  accelerations:  Abscent,  decelerations:  Present early decels UC:   regular, every 2-3 minutes, appropriate MVUs SVE:   Dilation: 5 Effacement (%): 80 Station: -2 Exam by:: Dr Morgan Todd Pitocin @ 8 mu/min  Labs: Lab Results  Component Value Date   WBC 9.3 08/03/2013   HGB 11.3* 08/03/2013   HCT 31.9* 08/03/2013   MCV 85.5 08/03/2013   PLT 199 08/03/2013    Assessment / Plan: IOL for post dates  Labor: cervix changed in effacement and station.  dilation still the same. will watch for 1 more hour and see what occurs  sve now 5/90/-1 Fetal Wellbeing:  Category II Pain Control:  Epidural Anticipated MOD:  NSVD  Morgan Todd L, MD 08/04/2013, 3:14 PM

## 2013-08-04 NOTE — Anesthesia Postprocedure Evaluation (Signed)
  Anesthesia Post-op Note  Patient: Morgan Todd  Procedure(s) Performed: Procedure(s): CESAREAN SECTION (N/A)  Patient Location: PACU  Anesthesia Type:Epidural  Level of Consciousness: awake, alert  and oriented  Airway and Oxygen Therapy: Patient Spontanous Breathing  Post-op Pain: none  Post-op Assessment: Post-op Vital signs reviewed, Patient's Cardiovascular Status Stable, Respiratory Function Stable, Patent Airway, No signs of Nausea or vomiting, Pain level controlled, No headache and No backache  Post-op Vital Signs: Reviewed and stable  Complications: No apparent anesthesia complications

## 2013-08-04 NOTE — Progress Notes (Signed)
Pt states she is concerned about the pain in her lower right quadrant that existed preop and is now returning postop.  Spoke with Dr. Despina Hidden who explained that pt did not have a uterine rupture as was previously questioned, relayed information to pt and attempted to reassure her. Also, discussed with Dr. Despina Hidden bleeding that is present on honeycomb dressing, order given to reinforce dressing if needed.  Encouraged pt to take pain medication when available and call nurse with any further concerns.

## 2013-08-04 NOTE — Preoperative (Signed)
Beta Blockers   Reason not to administer Beta Blockers:Not Applicable 

## 2013-08-04 NOTE — Progress Notes (Signed)
Faculty Practice OB/GYN Attending Note  Subjective:  Called to evaluate patient with constant,severe pain in her right lower abdomen, around her previous incision.  Pain started a few minutes ago and is getting worse.  FHR reassuring, contractions are every 2 - 4 minutes, no vaginal bleeding. Good FM.   Admitted on 08/03/2013 for TOLAC for postdates.   Objective:  Blood pressure 132/81, pulse 60, temperature 98.4 F (36.9 C), temperature source Oral, resp. rate 18, height 4\' 11"  (1.499 m), weight 149 lb (67.586 kg), last menstrual period 09/25/2012, SpO2 100.00%. FHT  Baseline 150 bpm, moderate variability, +accelerations, early/variable decelerations Toco: q 1.5 -4 minutes Gen: NAD Abdomen: +right sided lower fundal pain, no rebound, no tenderness Cervix: Dilation: 5 Effacement (%): 90 Cervical Position: Middle Station: -1 Presentation: Vertex Exam by:: Dr. Reola Calkins  Ext: 2+ DTRs, no edema, no cyanosis, negative Homan's sign  Assessment & Plan:  27 y.o. [id number] at [redacted]w[redacted]d admitted for TOLAC for postdates, now with constant abdominal pain.  Concerned about possible uterine incision separation.  Recommended cesarean section.  The risks of cesarean section discussed with the patient included but were not limited to: bleeding which may require transfusion or reoperation; infection which may require antibiotics; injury to bowel, bladder, ureters or other surrounding organs; injury to the fetus; need for additional procedures including hysterectomy in the event of a life-threatening hemorrhage; placental abnormalities wth subsequent pregnancies, incisional problems, thromboembolic phenomenon and other postoperative/anesthesia complications. The patient concurred with the proposed plan, giving informed written consent for the procedure.   Anesthesia and OR aware. To OR when ready. Surgery will be done by Dr. Despina Hidden.  Jaynie Collins, MD, FACOG Attending Obstetrician & Gynecologist Faculty Practice,  [facility] [place]

## 2013-08-04 NOTE — Transfer of Care (Signed)
Immediate Anesthesia Transfer of Care Note  Patient: Morgan Todd  Procedure(s) Performed: Procedure(s): CESAREAN SECTION (N/A)  Patient Location: PACU  Anesthesia Type:Epidural  Level of Consciousness: awake, alert  and oriented  Airway & Oxygen Therapy: Patient Spontanous Breathing  Post-op Assessment: Report given to PACU RN  Post vital signs: Reviewed  Complications: No apparent anesthesia complications

## 2013-08-04 NOTE — Progress Notes (Signed)
Morgan Todd is a 27 y.o. [id number] at [redacted]w[redacted]d  admitted for induction of labor due to Post dates. Due date 07/27/13.  Subjective:  Pt comfortable with epidural. +FM. No LOF, VB.  Objective: BP 116/75  Pulse 65  Temp(Src) 98.7 F (37.1 C) (Axillary)  Resp 16  Ht 4\' 11"  (1.499 m)  Wt 67.586 kg (149 lb)  BMI 30.08 kg/m2  SpO2 100%  LMP 09/25/2012 I/O last 3 completed shifts: In: -  Out: 1000 [Urine:1000]    FHT:  FHR: 130 bpm, variability: moderate,  accelerations:  Present,  decelerations:  Absent UC:   regular, every 2-4 minutes SVE:   Dilation: 6 Effacement (%): 80 Station: -2 Exam by:: H Koran RNC  Labs: Lab Results  Component Value Date   WBC 9.3 08/03/2013   HGB 11.3* 08/03/2013   HCT 31.9* 08/03/2013   MCV 85.5 08/03/2013   PLT 199 08/03/2013    Assessment / Plan: IOL for postdates. still in latent labor.  not progressing currently.   Labor: AROM performed with return of LARGE amount of clear fluid.  IUPC placed.  SVE 5/70/-2 and vertex.  will cont to monitor Fetal Wellbeing:  Category I Pain Control:  Epidural I/D:  n/a Anticipated MOD:  NSVD  Kenyon Eichelberger L 08/04/2013, 12:59 PM

## 2013-08-04 NOTE — Progress Notes (Signed)
Morgan Todd is a 27 y.o. [id number] at [redacted]w[redacted]d admitted for induction of labor due to Post dates. Due date 07/27/2013.  Subjective: Patient sleeping and comfortable with epidural  Objective: BP 109/57  Pulse 78  Temp(Src) 98.3 F (36.8 C) (Oral)  Resp 18  Ht 4\' 11"  (1.499 m)  Wt 67.586 kg (149 lb)  BMI 30.08 kg/m2  SpO2 100%  LMP 09/25/2012      FHT:  FHR: 135 bpm, variability: moderate,  accelerations:  Present,  decelerations:  Absent UC:   irregular, every 2-6 minutes SVE:   Dilation: 6 Effacement (%): 100 Station: -1 Exam by:: Dr Waynetta Sandy  Labs: Lab Results  Component Value Date   WBC 9.3 08/03/2013   HGB 11.3* 08/03/2013   HCT 31.9* 08/03/2013   MCV 85.5 08/03/2013   PLT 199 08/03/2013    Assessment / Plan: Induction of labor due to postterm Foley bulb sitting in vagina, deflated and retrieved  Labor: active labor Preeclampsia:  no signs or symptoms of toxicity Fetal Wellbeing:  Category I Pain Control:  epidural I/D:  n/a Anticipated MOD:  NSVD  Tawni Carnes 08/04/2013, 2:25 AM

## 2013-08-05 LAB — CBC
HCT: 26 % — ABNORMAL LOW (ref 36.0–46.0)
Hemoglobin: 9.2 g/dL — ABNORMAL LOW (ref 12.0–15.0)
MCH: 30.2 pg (ref 26.0–34.0)
MCHC: 35.4 g/dL (ref 30.0–36.0)

## 2013-08-05 NOTE — Progress Notes (Signed)
Subjective: Postpartum Day 1: Cesarean Delivery Patient reports nausea, vomiting, incisional pain, tolerating PO and + flatus.  Foley catheter still in.  Vomiting improved with Zofran.  Honeycomb dressing changed overnight  Objective: Vital signs in last 24 hours: Temp:  [97.9 F (36.6 C)-100 F (37.8 C)] 97.9 F (36.6 C) (08/14 0615) Pulse Rate:  [56-88] 56 (08/14 0615) Resp:  [12-20] 18 (08/14 0615) BP: (108-156)/(63-92) 136/68 mmHg (08/14 0618) SpO2:  [97 %-100 %] 97 % (08/14 0615)  Physical Exam:  General: alert, cooperative, appears stated age and no distress Lochia: appropriate Uterine Fundus: firm Incision: no significant erythema, honey comb dressing with minimal serous sanguenous saturation on left lateral aspect.  Incision intact.  Dressing has been changed DVT Evaluation: No evidence of DVT seen on physical exam. Negative Homan's sign. No cords or calf tenderness. No significant calf/ankle edema.   Recent Labs  08/03/13 1950 08/05/13 0555  HGB 11.3* 9.2*  HCT 31.9* 26.0*    Assessment/Plan: Status post Cesarean section. Doing well postoperatively.  Continue current care. Breast feeding Continue to observe for dressing saturation.    Anticipate d/c on 8/16 per pt request  RIGBY, MICHAEL 08/05/2013, 8:39 AM  I examined pt and agree with documentation above and resident plan of care.  Incision outlined to assess progression of drainage. [facility]

## 2013-08-05 NOTE — Anesthesia Postprocedure Evaluation (Signed)
  Anesthesia Post-op Note  Patient: Morgan Todd  Procedure(s) Performed: Procedure(s): CESAREAN SECTION (N/A)  Patient Location: Mother/Baby  Anesthesia Type:Epidural  Level of Consciousness: awake, alert  and oriented  Airway and Oxygen Therapy: Patient Spontanous Breathing  Post-op Pain: mild  Post-op Assessment: Post-op Vital signs reviewed, Patient's Cardiovascular Status Stable, No headache, No backache, No residual numbness and No residual motor weakness  Post-op Vital Signs: Reviewed and stable  Complications: No apparent anesthesia complications

## 2013-08-06 ENCOUNTER — Encounter (HOSPITAL_COMMUNITY): Payer: Self-pay | Admitting: Obstetrics & Gynecology

## 2013-08-06 NOTE — Progress Notes (Signed)
Subjective: Postpartum Day 2: Cesarean Delivery Patient reports feeling better overall.  Did have recurrent nightmares overnight but otherwise feeling good.  Pain was moderately controlled with oral pain medications.  She has been up ambulating in the room.  She has not had a bowel movement but has positive flatus.  Dressing was changed again overnight after taking a shower  Objective: Vital signs in last 24 hours: Temp:  [98.1 F (36.7 C)-98.3 F (36.8 C)] 98.1 F (36.7 C) (08/15 0605) Pulse Rate:  [56-60] 60 (08/15 0605) Resp:  [18] 18 (08/15 0605) BP: (119-125)/(75-84) 119/75 mmHg (08/15 1610)  Physical Exam:  General: alert, cooperative, appears stated age and no distress Lochia: appropriate Uterine Fundus: firm, appropriate tenderness Incision: no significant erythema, honey comb dressing clean dry and intact, there is no discharge DVT Evaluation: No evidence of DVT seen on physical exam. Negative Homan's sign. No cords or calf tenderness. No significant calf/ankle edema.   Recent Labs  08/03/13 1950 08/05/13 0555  HGB 11.3* 9.2*  HCT 31.9* 26.0*    Assessment/Plan: Status post Cesarean section. Doing well postoperatively.  Continue current care. Breast feeding Continue to observe for dressing saturation.    Anticipate d/c on 8/16 per pt request  [name], DO [facility] Resident - PGY-2 08/06/2013 8:54 AM

## 2013-08-06 NOTE — Progress Notes (Signed)
I spoke with and examined patient and agree with resident's note and plan of care.  Tawana Scale, MD OB Fellow 08/06/2013 11:06 PM

## 2013-08-07 MED ORDER — IBUPROFEN 600 MG PO TABS: 600.0000 mg | ORAL_TABLET | Freq: Four times a day (QID) | ORAL | Status: DC

## 2013-08-07 MED ORDER — NORETHINDRONE 0.35 MG PO TABS: 1.0000 | ORAL_TABLET | Freq: Every day | ORAL | Status: DC

## 2013-08-07 MED ORDER — OXYCODONE-ACETAMINOPHEN 5-325 MG PO TABS
1.0000 | ORAL_TABLET | ORAL | Status: DC | PRN
Start: 2013-08-07 — End: 2013-08-13

## 2013-08-07 NOTE — Discharge Summary (Cosign Needed)
Obstetric Discharge Summary Reason for Admission: onset of labor Prenatal Procedures: NST and ultrasound Intrapartum Procedures: cesarean: low cervical, transverse Postpartum Procedures: none Complications-Operative and Postpartum: none Hemoglobin  Date Value Range Status  08/05/2013 9.2* 12.0 - 15.0 g/dL Final     DELTA CHECK NOTED     REPEATED TO VERIFY     HCT  Date Value Range Status  08/05/2013 26.0* 36.0 - 46.0 % Final    Brief Hospital Course: Pt presented with onset of labor on day scheduled for post-dates induction.  Pt underwent AROM and augmentation with pitocin during intrapartum course during TOLAC.  Pt had an an arrest of progression with cervical dilation at 5cm for >6 hours.  IUPC was placed that demonstrated adequate contractions.  Pt began to experience moderate abdominal discomfort and decision was made to proceed with a LTCS due to concerns for early uterine rupture.  LTCS revealed a markedly thin anterior uterine wall but was otherwise uncomplicated.  Pt stayed until post op day 3 and was discharged home with return precautions provided.    Physical Exam:  General: alert, cooperative and no distress Lochia: appropriate Uterine Fundus: firm Incision: healing well, no significant drainage, no dehiscence, no significant erythema, honeycomb dressing clean dry and intact DVT Evaluation: No evidence of DVT seen on physical exam. Negative Homan's sign. No cords or calf tenderness. No significant calf/ankle edema.  Discharge Diagnoses: Post-date pregnancy and delivered  Discharge Information: Date: 08/07/2013 Activity: pelvic rest and lifting restrictions Diet: routine Medications: Ibuprofen, Iron and Percocet Condition: stable Instructions: refer to practice specific booklet Discharge to: home Follow-up Information   Follow up with RIGBY, Yonah Tangeman, DO. Schedule an appointment as soon as possible for a visit on 09/03/2013. (@ 900 for Nexplanon and Post partum)    Specialty:  Family Medicine   Contact information:   [address] [place] [address] [phone]       Call RIGBY, Tammatha Cobb, DO. ( if needed for wound concerns)    Specialty:  Family Medicine   Contact information:   [address] [place] [address] [phone]       Breast Feeding Plans for Nexplanon at post partum visit Given return precautions   Newborn Data: Live born female  Birth Weight: 7 lb 12 oz (3515 g) APGAR: 3, 7  Home with mother.  [name], DO [facility] Resident - PGY-2 08/07/2013 9:07 AM  I spoke with and examined patient and agree with resident's note and plan of care.  Tawana Scale, MD OB Fellow 08/07/2013 9:19 AM

## 2013-08-11 ENCOUNTER — Telehealth: Payer: Self-pay | Admitting: Sports Medicine

## 2013-08-11 NOTE — Telephone Encounter (Signed)
Pt is requesting refill on oxycodone be sent to her pharmacy on file. JW

## 2013-08-13 ENCOUNTER — Telehealth: Payer: Self-pay | Admitting: Sports Medicine

## 2013-08-13 MED ORDER — HYDROCODONE-ACETAMINOPHEN 5-325 MG PO TABS: 1.0000 | ORAL_TABLET | Freq: Three times a day (TID) | ORAL | Status: DC | PRN

## 2013-08-13 NOTE — Telephone Encounter (Signed)
:   Spoke with Morgan Todd.  She is continuing to have some mild pain especially while sleeping at night.  She is now out of her Percocet and she was taking them mainly just at night.  Continues to take her ibuprofen.  She denies any fevers, chills.  Reports that her wound seems to be doing well she is not having specific concerns.  I have sent any prescription for hydrocodone 05/325 #30 with no refills to the taken 1-2 by mouth when necessary.  She is to followup next week if she is continuing to have pain and has also been given the option to be seen in the MAU significantly worsening.

## 2013-08-13 NOTE — Addendum Note (Signed)
Addended by: Gaspar Bidding D on: 08/13/2013 05:43 PM   Modules accepted: Orders, Medications

## 2013-08-19 NOTE — Telephone Encounter (Signed)
error 

## 2013-08-21 ENCOUNTER — Telehealth: Payer: Self-pay | Admitting: Sports Medicine

## 2013-08-21 NOTE — Telephone Encounter (Signed)
[facility] 24 Hour After-hours Emergency Line  Patient name: Morgan Todd  MRN: [id number]  AGE: 27 y.o.  Gender: female DOB: 01/26/1986     Primary Care Provider:   Gaspar Bidding, DO     The patient called reporting that she felt like she opened her cesarean section wound when changing clothes tonight.  She reports she feels like a piece of clothing was stuck to her wound and when removing it opened a small area on the incision from her cesarean section from 2 weeks ago.  She denied any significant pain.  She reports there is no gaping of the wound but there is raw skin that was essentially under a scab.  She does not think she could probe this and she denies any opening of the wound.  She is not having any significant pain.  She is not having any fevers, chills, nausea, vomiting.   --- DISPOSITION: Discussed further red flags for need for evaluation at the MAU.  At this point she does feel like a scab itself was likely removed and she will place Vaseline over this area.  She will be seen in the MAU if any red flags occur such as nausea, vomiting, fevers, chills, further opening of the wound or pain in this area.   [name], DO [facility] Resident - PGY-3 08/21/2013 12:29 AM

## 2013-09-03 ENCOUNTER — Ambulatory Visit: Payer: Medicaid Other | Admitting: Sports Medicine

## 2013-09-07 ENCOUNTER — Telehealth: Payer: Self-pay | Admitting: Sports Medicine

## 2013-09-07 NOTE — Telephone Encounter (Signed)
Patient unsure if she is having an allergic reaction to something.  Has "wheals" on arms, face, & legs x 3 days  C/o itching, especially at night.  Takes Benadryl at night and wheals/itching go away, but return at next day.  Denies any shortness of breath, blisters, or fever.  Denies any new lotions, foods, deodorants, soaps/detergents.  Offered appt for tomorrow or patient can go to urgent care today.  Patient prefers to come here tomorrow and appt scheduled with Dr. Casper Harrison for 10:15 am.  Gaylene Brooks, RN

## 2013-09-08 ENCOUNTER — Ambulatory Visit: Payer: Medicaid Other | Admitting: Family Medicine

## 2013-09-13 ENCOUNTER — Ambulatory Visit: Payer: Medicaid Other | Admitting: Sports Medicine

## 2013-10-21 ENCOUNTER — Telehealth: Payer: Self-pay | Admitting: *Deleted

## 2013-10-21 NOTE — Telephone Encounter (Signed)
LVM for patient to call back. Just wanted to touch base with patient about an appointment. Please forward patient to me if she calls

## 2013-10-21 NOTE — Telephone Encounter (Signed)
LVM

## 2013-11-02 ENCOUNTER — Ambulatory Visit (INDEPENDENT_AMBULATORY_CARE_PROVIDER_SITE_OTHER): Payer: Medicaid Other | Admitting: Sports Medicine

## 2013-11-02 ENCOUNTER — Encounter: Payer: Self-pay | Admitting: Sports Medicine

## 2013-11-02 VITALS — BP 117/73 | HR 97 | Temp 98.7°F | Ht 59.0 in | Wt 131.4 lb

## 2013-11-02 DIAGNOSIS — Z309 Encounter for contraceptive management, unspecified: Secondary | ICD-10-CM | POA: Insufficient documentation

## 2013-11-02 DIAGNOSIS — Z3046 Encounter for surveillance of implantable subdermal contraceptive: Secondary | ICD-10-CM

## 2013-11-02 DIAGNOSIS — Z30017 Encounter for initial prescription of implantable subdermal contraceptive: Secondary | ICD-10-CM

## 2013-11-02 HISTORY — DX: Encounter for contraceptive management, unspecified: Z30.9

## 2013-11-02 LAB — POCT URINE PREGNANCY: Preg Test, Ur: NEGATIVE

## 2013-11-02 MED ORDER — ETONOGESTREL 68 MG ~~LOC~~ IMPL
68.0000 mg | DRUG_IMPLANT | Freq: Once | SUBCUTANEOUS | Status: AC
  Administered 2013-11-02: 68 mg via SUBCUTANEOUS

## 2013-11-02 NOTE — Patient Instructions (Signed)
Etonogestrel implant What is this medicine? ETONOGESTREL (et oh noe JES trel) is a contraceptive (birth control) device. It is used to prevent pregnancy. It can be used for up to 3 years. This medicine may be used for other purposes; ask your health care provider or pharmacist if you have questions. COMMON BRAND NAME(S): Implanon, Nexplanon  What should I tell my health care provider before I take this medicine? They need to know if you have any of these conditions: -abnormal vaginal bleeding -blood vessel disease or blood clots -cancer of the breast, cervix, or liver -depression -diabetes -gallbladder disease -headaches -heart disease or recent heart attack -high blood pressure -high cholesterol -kidney disease -liver disease -renal disease -seizures -tobacco smoker -an unusual or allergic reaction to etonogestrel, other hormones, anesthetics or antiseptics, medicines, foods, dyes, or preservatives -pregnant or trying to get pregnant -breast-feeding How should I use this medicine? This device is inserted just under the skin on the inner side of your upper arm by a health care professional. Talk to your pediatrician regarding the use of this medicine in children. Special care may be needed. Overdosage: If you think you've taken too much of this medicine contact a poison control center or emergency room at once. Overdosage: If you think you have taken too much of this medicine contact a poison control center or emergency room at once. NOTE: This medicine is only for you. Do not share this medicine with others. What if I miss a dose? This does not apply. What may interact with this medicine? Do not take this medicine with any of the following medications: -amprenavir -bosentan -fosamprenavir This medicine may also interact with the following medications: -barbiturate medicines for inducing sleep or treating seizures -certain medicines for fungal infections like ketoconazole and  itraconazole -griseofulvin -medicines to treat seizures like carbamazepine, felbamate, oxcarbazepine, phenytoin, topiramate -modafinil -phenylbutazone -rifampin -some medicines to treat HIV infection like atazanavir, indinavir, lopinavir, nelfinavir, tipranavir, ritonavir -St. John's wort This list may not describe all possible interactions. Give your health care provider a list of all the medicines, herbs, non-prescription drugs, or dietary supplements you use. Also tell them if you smoke, drink alcohol, or use illegal drugs. Some items may interact with your medicine. What should I watch for while using this medicine? This product does not protect you against HIV infection (AIDS) or other sexually transmitted diseases. You should be able to feel the implant by pressing your fingertips over the skin where it was inserted. Tell your doctor if you cannot feel the implant. What side effects may I notice from receiving this medicine? Side effects that you should report to your doctor or health care professional as soon as possible: -allergic reactions like skin rash, itching or hives, swelling of the face, lips, or tongue -breast lumps -changes in vision -confusion, trouble speaking or understanding -dark urine -depressed mood -general ill feeling or flu-like symptoms -light-colored stools -loss of appetite, nausea -right upper belly pain -severe headaches -severe pain, swelling, or tenderness in the abdomen -shortness of breath, chest pain, swelling in a leg -signs of pregnancy -sudden numbness or weakness of the face, arm or leg -trouble walking, dizziness, loss of balance or coordination -unusual vaginal bleeding, discharge -unusually weak or tired -yellowing of the eyes or skin Side effects that usually do not require medical attention (Report these to your doctor or health care professional if they continue or are bothersome.): -acne -breast pain -changes in  weight -cough -fever or chills -headache -irregular menstrual bleeding -itching, burning,   and vaginal discharge -pain or difficulty passing urine -sore throat This list may not describe all possible side effects. Call your doctor for medical advice about side effects. You may report side effects to FDA at 1-800-FDA-1088. Where should I keep my medicine? This drug is given in a hospital or clinic and will not be stored at home. NOTE: This sheet is a summary. It may not cover all possible information. If you have questions about this medicine, talk to your doctor, pharmacist, or health care provider.  2014, Elsevier/Gold Standard. (2012-06-15 15:37:45)  

## 2013-11-03 NOTE — Progress Notes (Signed)
[facility] Morgan Todd - 27 y.o. female MRN [id number]  Date of birth: 10-02-1986  CC, HPI, INTERVAL HISTORY & ROS  Morgan Todd is here today for Nexplanon Insertion    She reports desire for reversible long term sterility  No current vaginal bleeding, discharge.  Pt denies any fevers, chills, or rigors.  Right-handed  Patient aware of Nexplanon side effects predominantly expected 16-17 days of bleeding and a 90 day period that can be irregular.   History  Past Medical, Surgical, Social, and Family History Reviewed per EMR Medications and Allergies reviewed and all updated if necessary. Objective Findings  VITALS: HR: 97 bpm  BP: 117/73 mmHg  TEMP: 98.7 F (37.1 C) (Oral)  RESP:    HT: 4\' 11"  (149.9 cm)  WT: 131 lb 6.4 oz (59.603 kg)  BMI: 26.6   BP Readings from Last 3 Encounters:  11/02/13 117/73  08/07/13 149/86  08/07/13 149/86   Wt Readings from Last 3 Encounters:  11/02/13 131 lb 6.4 oz (59.603 kg)  08/03/13 149 lb (67.586 kg)  08/03/13 149 lb (67.586 kg)     PHYSICAL EXAM: GENERAL:  adult [place] female. In no discomfort; no respiratory distress  PSYCH: alert and appropriate, good insight   HNEENT:   CARDIO:   LUNGS:   ABDOMEN:   EXTREM:  Warm, well perfused.  Moves all 4 extremities spontaneously; no lateralization.  No noted foot lesions.  Distal pulses normal.    GU:   SKIN:     Assessment & Plan   Problems addressed today: General Plan & Pt Instructions:  1. Contraception management   2. Insertion of implantable subdermal contraceptive   3. Surveillance of previously prescribed implantable subdermal contraceptive       Nexplanon Insertion Today     For further discussion of A/P and for follow up issues see problem based charting if applicable.

## 2013-11-03 NOTE — Assessment & Plan Note (Signed)
PROCEDURE NOTE: NEXPLANON  INSERTION Patient given informed consent and signed copy in the chart.  Pregnancy test was negative  Appropriate time out was taken. left arm was prepped and draped in the usual sterile fashion. Appropriate measurement was made for insertion of nexplanon and landmarks identified, insertion site marked. 3 cc of 2% lidocaine with epinephrine was used for local anesthesia. Once anesthesia obtained, nexplanon was inserted in typical fashion per manufacturer's directions.  No complications; less than 1cc blood loss.  Insertion site covered with steristrip & pressure bandage applied to decrease bruising. The patient tolerated the procedure well.  Patient given follow up instructions should she experience redness, swelling at sight or fever in the next 24 hours. Patient given Nexplanon pocket card.  Planned removal in 3 years.

## 2013-11-04 ENCOUNTER — Encounter: Payer: Self-pay | Admitting: *Deleted

## 2013-11-17 ENCOUNTER — Ambulatory Visit: Payer: Medicaid Other | Admitting: Family Medicine

## 2014-07-25 ENCOUNTER — Telehealth: Payer: Self-pay | Admitting: Family Medicine

## 2014-07-25 NOTE — Telephone Encounter (Signed)
Patient has a yeast infection and is requesting Diflucan to be sent to pharmacy. Patient states she is unable to buy OTC because is nursing. Please advise.

## 2014-07-25 NOTE — Telephone Encounter (Signed)
Patient calls again, states that she needs meds urgently. Please call once completed.

## 2014-07-26 ENCOUNTER — Telehealth: Payer: Self-pay | Admitting: *Deleted

## 2014-07-26 NOTE — Telephone Encounter (Signed)
Message copied by Tanna SavoyPROPOSITO, Gracie Gupta S on Tue Jul 26, 2014 10:40 AM ------      Message from: Raliegh IpGOTTSCHALK, ASHLY M      Created: Mon Jul 25, 2014  4:19 PM       Patient needs to be seen for yeast infection in order for medication to be called in.  Thank you. ------

## 2014-07-26 NOTE — Telephone Encounter (Signed)
Relayed message,patient voiced understanding. Morgan Todd S  

## 2014-07-26 NOTE — Telephone Encounter (Signed)
Please advise.Thank you.Rolonda Pontarelli S  

## 2014-07-26 NOTE — Telephone Encounter (Signed)
As discussed in yesterday's message, patient needs appointment for yeast infection medications.  Thank you.

## 2014-07-27 ENCOUNTER — Encounter: Payer: Self-pay | Admitting: Family Medicine

## 2014-07-27 ENCOUNTER — Ambulatory Visit (INDEPENDENT_AMBULATORY_CARE_PROVIDER_SITE_OTHER): Payer: Medicaid Other | Admitting: Family Medicine

## 2014-07-27 VITALS — BP 125/86 | HR 75 | Temp 98.1°F | Ht 59.0 in | Wt 129.2 lb

## 2014-07-27 DIAGNOSIS — N760 Acute vaginitis: Secondary | ICD-10-CM | POA: Insufficient documentation

## 2014-07-27 LAB — POCT WET PREP (WET MOUNT): CLUE CELLS WET PREP WHIFF POC: POSITIVE

## 2014-07-27 MED ORDER — METRONIDAZOLE 0.75 % VA GEL: 1.0000 | Freq: Two times a day (BID) | VAGINAL | Status: DC

## 2014-07-27 MED ORDER — FLUCONAZOLE 150 MG PO TABS: 150.0000 mg | ORAL_TABLET | Freq: Once | ORAL | Status: DC

## 2014-07-27 NOTE — Patient Instructions (Signed)
It was nice to see you today.  You have BV and a yeast infection.  I have called in 2 Rx's for this.  Both preparations are okay w/ breast feeding.

## 2014-07-27 NOTE — Assessment & Plan Note (Addendum)
Yeast and Clue cells seen on Wet prep. Patient is currently breastfeeding, so will treat with Metrogel and Diflucan.

## 2014-07-27 NOTE — Progress Notes (Signed)
   Subjective:    Patient ID: Morgan Todd, female    DOB: 03/20/1986, 28 y.o.   MRN: [id number]  HPI 28 year old female presents for a same day appointment with concerns that she has a yeast infection.  1) ? Yeast infection - Onset: 1 week ago  - Description: She has been experiencing severe vaginal itching with associated odor.  No reported vaginal discharge. - No relieving factors. No OTC treatments tried.   Associated Symptoms: Dysuria: no  Bleeding: no  Pelvic pain: no  Back pain: no  Fever: no   Review of Systems Per HPI    Objective:   Physical Exam Filed Vitals:   07/27/14 0939  BP: 125/86  Pulse: 75  Temp: 98.1 F (36.7 C)   General: well appearing female in NAD. Pelvic Exam:        External: normal female genitalia without lesions or masses        Vagina: white discharge noted. Otherwise normal.        Cervix: normal without lesions or masses.        Sample for wet prep obtained.    Assessment & Plan:  See Problem List

## 2014-08-04 ENCOUNTER — Other Ambulatory Visit: Payer: Self-pay | Admitting: Family Medicine

## 2014-09-16 ENCOUNTER — Ambulatory Visit: Payer: Medicaid Other | Admitting: Family Medicine

## 2014-10-24 ENCOUNTER — Encounter: Payer: Self-pay | Admitting: Family Medicine

## 2015-01-06 ENCOUNTER — Telehealth: Payer: Self-pay | Admitting: Family Medicine

## 2015-01-06 ENCOUNTER — Other Ambulatory Visit: Payer: Medicaid Other

## 2015-01-06 ENCOUNTER — Encounter: Payer: Self-pay | Admitting: Family Medicine

## 2015-01-06 ENCOUNTER — Ambulatory Visit (INDEPENDENT_AMBULATORY_CARE_PROVIDER_SITE_OTHER): Payer: Medicaid Other | Admitting: Family Medicine

## 2015-01-06 VITALS — BP 114/79 | HR 85 | Temp 97.8°F | Ht 60.25 in | Wt 125.0 lb

## 2015-01-06 DIAGNOSIS — F17200 Nicotine dependence, unspecified, uncomplicated: Secondary | ICD-10-CM | POA: Insufficient documentation

## 2015-01-06 DIAGNOSIS — Z720 Tobacco use: Secondary | ICD-10-CM

## 2015-01-06 DIAGNOSIS — Z23 Encounter for immunization: Secondary | ICD-10-CM

## 2015-01-06 DIAGNOSIS — R059 Cough, unspecified: Secondary | ICD-10-CM | POA: Insufficient documentation

## 2015-01-06 DIAGNOSIS — J4520 Mild intermittent asthma, uncomplicated: Secondary | ICD-10-CM

## 2015-01-06 DIAGNOSIS — R05 Cough: Secondary | ICD-10-CM | POA: Insufficient documentation

## 2015-01-06 MED ORDER — ALBUTEROL SULFATE HFA 108 (90 BASE) MCG/ACT IN AERS: 2.0000 | INHALATION_SPRAY | Freq: Four times a day (QID) | RESPIRATORY_TRACT | Status: DC | PRN

## 2015-01-06 MED ORDER — NICOTINE 7 MG/24HR TD PT24: 7.0000 mg | MEDICATED_PATCH | Freq: Every day | TRANSDERMAL | Status: DC

## 2015-01-06 MED ORDER — BENZONATATE 100 MG PO CAPS: 100.0000 mg | ORAL_CAPSULE | Freq: Two times a day (BID) | ORAL | Status: DC | PRN

## 2015-01-06 NOTE — Assessment & Plan Note (Signed)
Stop Mucinex with Tylenol -Start Tessalon perles -Resume qvar daily and use albuterol scheduled for next 2 days -Encouraged fluids -Smoking cessation counseling -Return if no improvement

## 2015-01-06 NOTE — Progress Notes (Signed)
Patient ID: Morgan Hutchingionne N Posa, female   DOB: 1986-06-09, 29 y.o.   MRN: [id number] . Subjective:    Morgan Todd is a 29 y.o. female and is here for a comprehensive physical exam. The patient reports problems - congestion since Christmas eve with cough that developed shortly thereafter.  Patient states that cough is nonproductive but that she feels liks something is trying to come up.  She has been using Mucinex with Tylenol with moderate relief.  She denies fevers, chills, SOB.  She has not been using her inhaler and she started back smoking for the last 3 months..  History   Social History  . Marital Status: Married    Spouse Name: N/A    Number of Children: N/A  . Years of Education: N/A   Occupational History  . Not on file.   Social History Main Topics  . Smoking status: Light Tobacco Smoker -- 0.10 packs/day    Last Attempt to Quit: 10/30/2012  . Smokeless tobacco: Never Used  . Alcohol Use: No  . Drug Use: No  . Sexual Activity: Yes   Other Topics Concern  . Not on file   Social History Narrative   Works at [employer].  Plans to continue working through pregnancy   Lives with Husband and Son [name]   Health Maintenance  Topic Date Due  . INFLUENZA VACCINE  07/23/2014  . PAP SMEAR  12/30/2015  . TETANUS/TDAP  04/30/2023    The following portions of the patient's history were reviewed and updated as appropriate: allergies, current medications, past family history, past medical history, past social history, past surgical history and problem list.  Review of Systems Constitutional: positive for fatigue Eyes: negative Ears, nose, mouth, throat, and face: positive for nasal congestion Respiratory: positive for cough Cardiovascular: negative Gastrointestinal: positive for constipation and intermittently. takes Probiotic to help with GI regulation. Genitourinary:negative Integument/breast: negative Hematologic/lymphatic:  negative Musculoskeletal:negative Neurological: negative Behavioral/Psych: negative Endocrine: negative Allergic/Immunologic: positive for hay fever   Objective:    BP 114/79 mmHg  Pulse 85  Temp(Src) 97.8 F (36.6 C) (Oral)  Ht 5' 0.25" (1.53 m)  Wt 125 lb (56.7 kg)  BMI 24.22 kg/m2 General appearance: alert, cooperative, appears stated age and no distress Head: Normocephalic, without obvious abnormality, atraumatic Eyes: conjunctivae/corneas clear. PERRL, EOM's intact. Fundi benign. Ears: normal TM's and external ear canals both ears Nose: Nares normal. Septum midline. Mucosa normal. No drainage or sinus tenderness., moderate congestion, no sinus tenderness Throat: lips, mucosa, and tongue normal; teeth and gums normal Neck: no adenopathy, no carotid bruit, no JVD, supple, symmetrical, trachea midline and thyroid: normal to inspection and palpation and full feeling. but no palpable asymmetry or nodules. Nontender Back: symmetric, no curvature. ROM normal. No CVA tenderness. Lungs: clear to auscultation bilaterally Heart: regular rate and rhythm, S1, S2 normal, no murmur, click, rub or gallop Abdomen: soft, non-tender; bowel sounds normal; no masses,  no organomegaly Extremities: extremities normal, atraumatic, no cyanosis or edema Pulses: 2+ and symmetric Skin: Skin color, texture, turgor normal. No rashes or lesions Lymph nodes: Cervical, supraclavicular, and axillary nodes normal. Neurologic: Alert and oriented X 3, normal strength and tone. Normal symmetric reflexes. Normal coordination and gait    Assessment:    Healthy female exam.      Plan:     See After Visit Summary for Counseling Recommendations    Seth Friedlander M. Nadine CountsGottschalk, DO PGY-1, [facility]

## 2015-01-06 NOTE — Assessment & Plan Note (Signed)
CBC ordered 

## 2015-01-06 NOTE — Telephone Encounter (Signed)
Sent copy of physical to patient.  Ashly M. Nadine CountsGottschalk, DO PGY-1, [facility]

## 2015-01-06 NOTE — Patient Instructions (Signed)
It was a pleasure seeing you today!  Information regarding what we discussed is included in this packet.  Please feel free to call our office if any questions or concerns arise.  You can schedule an appointment for labs and for smoking cessation counseling with Dr Raymondo BandKoval .  Make sure that you do not eat or drink anything but water or black coffee before your labs. I will contact you with the results of your labs.  If anything is abnormal, I will call you.  Otherwise, expect a copy mailed to you.  You will be due for your pap next year.  I encourage you to use your inhalers in addition to tessalon perles for cough.  Remember that cough can persist for up to 6 months after a viral illness.  If you develop fevers or chills or shortness of breath not relieved by inhalers please seek medical attention.   Ashly M. Nadine CountsGottschalk, DO PGY-1, [facility]    You Can Quit Smoking If you are ready to quit smoking or are thinking about it, congratulations! You have chosen to help yourself be healthier and live longer! There are lots of different ways to quit smoking. Nicotine gum, nicotine patches, a nicotine inhaler, or nicotine nasal spray can help with physical craving. Hypnosis, support groups, and medicines help break the habit of smoking. TIPS TO GET OFF AND STAY OFF CIGARETTES  Learn to predict your moods. Do not let a bad situation be your excuse to have a cigarette. Some situations in your life might tempt you to have a cigarette.  Ask friends and co-workers not to smoke around you.  Make your home smoke-free.  Never have "just one" cigarette. It leads to wanting another and another. Remind yourself of your decision to quit.  On a card, make a list of your reasons for not smoking. Read it at least the same number of times a day as you have a cigarette. Tell yourself everyday, "I do not want to smoke. I choose not to smoke."  Ask someone at home or work to help you with your plan to quit  smoking.  Have something planned after you eat or have a cup of coffee. Take a walk or get other exercise to perk you up. This will help to keep you from overeating.  Try a relaxation exercise to calm you down and decrease your stress. Remember, you may be tense and nervous the first two weeks after you quit. This will pass.  Find new activities to keep your hands busy. Play with a pen, coin, or rubber band. Doodle or draw things on paper.  Brush your teeth right after eating. This will help cut down the craving for the taste of tobacco after meals. You can try mouthwash too.  Try gum, breath mints, or diet candy to keep something in your mouth. IF YOU SMOKE AND WANT TO QUIT:  Do not stock up on cigarettes. Never buy a carton. Wait until one pack is finished before you buy another.  Never carry cigarettes with you at work or at home.  Keep cigarettes as far away from you as possible. Leave them with someone else.  Never carry matches or a lighter with you.  Ask yourself, "Do I need this cigarette or is this just a reflex?"  Bet with someone that you can quit. Put cigarette money in a piggy bank every morning. If you smoke, you give up the money. If you do not smoke, by the end  of the week, you keep the money.  Keep trying. It takes 21 days to change a habit!  Talk to your doctor about using medicines to help you quit. These include nicotine replacement gum, lozenges, or skin patches. Document Released: 10/05/2009 Document Revised: 03/02/2012 Document Reviewed: 10/05/2009 [facility] Patient Information 2015 [place], [place]. This information is not intended to replace advice given to you by your health care provider. Make sure you discuss any questions you have with your health care provider.

## 2015-01-06 NOTE — Assessment & Plan Note (Signed)
Controlled.  Uses albuterol and Qvar in summer -RF albuterol today

## 2015-01-06 NOTE — Assessment & Plan Note (Signed)
Patient has quit intermittently.  Smoking 1 pp week. Action phase -She will schedule an appointment with Dr Raymondo BandKoval -Nicotine patch 7mcg -Smoking cessation counseling.

## 2015-01-09 ENCOUNTER — Encounter: Payer: Self-pay | Admitting: Family Medicine

## 2015-01-09 ENCOUNTER — Ambulatory Visit (INDEPENDENT_AMBULATORY_CARE_PROVIDER_SITE_OTHER): Payer: Medicaid Other | Admitting: Family Medicine

## 2015-01-09 VITALS — BP 126/76 | HR 73 | Temp 98.1°F | Wt 125.0 lb

## 2015-01-09 DIAGNOSIS — F17200 Nicotine dependence, unspecified, uncomplicated: Secondary | ICD-10-CM

## 2015-01-09 DIAGNOSIS — R059 Cough, unspecified: Secondary | ICD-10-CM

## 2015-01-09 DIAGNOSIS — E0789 Other specified disorders of thyroid: Secondary | ICD-10-CM | POA: Insufficient documentation

## 2015-01-09 DIAGNOSIS — J4520 Mild intermittent asthma, uncomplicated: Secondary | ICD-10-CM

## 2015-01-09 DIAGNOSIS — Z720 Tobacco use: Secondary | ICD-10-CM

## 2015-01-09 DIAGNOSIS — R05 Cough: Secondary | ICD-10-CM

## 2015-01-09 DIAGNOSIS — Z111 Encounter for screening for respiratory tuberculosis: Secondary | ICD-10-CM

## 2015-01-09 DIAGNOSIS — Z13220 Encounter for screening for lipoid disorders: Secondary | ICD-10-CM

## 2015-01-09 HISTORY — DX: Other specified disorders of thyroid: E07.89

## 2015-01-09 LAB — LIPID PANEL
Cholesterol: 214 mg/dL — ABNORMAL HIGH (ref 0–200)
HDL: 46 mg/dL (ref >39)
LDL Cholesterol: 145 mg/dL — ABNORMAL HIGH (ref 0–99)
Total CHOL/HDL Ratio: 4.7 Ratio
Triglycerides: 115 mg/dL (ref <150)
VLDL: 23 mg/dL (ref 0–40)

## 2015-01-09 LAB — COMPREHENSIVE METABOLIC PANEL
ALT: 82 U/L — AB (ref 0–35)
AST: 66 U/L — AB (ref 0–37)
Albumin: 4 g/dL (ref 3.5–5.2)
Alkaline Phosphatase: 84 U/L (ref 39–117)
BUN: 7 mg/dL (ref 6–23)
CALCIUM: 9.1 mg/dL (ref 8.4–10.5)
CHLORIDE: 103 meq/L (ref 96–112)
CO2: 26 mEq/L (ref 19–32)
CREATININE: 0.66 mg/dL (ref 0.50–1.10)
Glucose, Bld: 75 mg/dL (ref 70–99)
POTASSIUM: 4.7 meq/L (ref 3.5–5.3)
SODIUM: 135 meq/L (ref 135–145)
Total Bilirubin: 0.6 mg/dL (ref 0.2–1.2)
Total Protein: 7.8 g/dL (ref 6.0–8.3)

## 2015-01-09 LAB — CBC
HEMATOCRIT: 37.9 % (ref 36.0–46.0)
HEMOGLOBIN: 13.1 g/dL (ref 12.0–15.0)
MCH: 29.2 pg (ref 26.0–34.0)
MCHC: 34.6 g/dL (ref 30.0–36.0)
MCV: 84.4 fL (ref 78.0–100.0)
MPV: 11 fL (ref 8.6–12.4)
Platelets: 284 10*3/uL (ref 150–400)
RBC: 4.49 MIL/uL (ref 3.87–5.11)
RDW: 13.5 % (ref 11.5–15.5)
WBC: 5.4 10*3/uL (ref 4.0–10.5)

## 2015-01-09 LAB — TSH: TSH: 0.557 u[IU]/mL (ref 0.350–4.500)

## 2015-01-09 NOTE — Assessment & Plan Note (Signed)
Improving with resuming of Qvar and use of Tessalon perles -No red flags -Patient to f/u PRN worsening

## 2015-01-09 NOTE — Patient Instructions (Addendum)
It was a pleasure seeing you today, Morgan Todd!  Information regarding what we discussed is included in this packet.  I will contact you will the results of your labs.  If anything is abnormal, I will call you.  Otherwise, expect a copy mailed to you.    Please feel free to call our office at [phone] if any questions or concerns arise.  Warm Regards, Ashly M. Gottschalk, DO  Tuberculin Skin Test The PPD skin test is a method used to help with the diagnosis of a disease called tuberculosis (TB). HOW THE TEST IS DONE  The test site (usually the forearm) is cleansed. The PPD extract is then injected under the top layer of skin, causing a blister to form on the skin. The reaction will take 48 - 72 hours to develop. You must return to your health care provider within that time to have the area checked. This will determine whether you have had a significant reaction to the PPD test. A reaction is measured in millimeters of hard swelling (induration) at the site. PREPARATION FOR TEST  There is no special preparation for this test. People with a skin rash or other skin irritations on their arms may need to have the test performed at a different spot on the body. Tell your health care provider if you have ever had a positive PPD skin test. If so, you should not have a repeat PPD test. Tell your doctor if you have a medical condition or if you take certain drugs, such as steroids, that can affect your immune system. These situations may lead to inaccurate test results. NORMAL FINDINGS A negative reaction (no induration) or a level of hard swelling that falls below a certain cutoff may mean that a person has not been infected with the bacteria that cause TB. There are different cutoffs for children, people with HIV, and other risk groups. Unfortunately, this is not a perfect test, and up to 20% of people infected with tuberculosis may not have a reaction on the PPD skin test. In addition, certain  conditions that affect the immune system (cancer, recent chemotherapy, late-stage AIDS) may cause a false-negative test result.  The reaction will take 48 - 72 hours to develop. You must return to your health care provider within that time to have the area checked. Follow your caregiver's instructions as to where and when to report for this to be done. Ranges for normal findings may vary among different laboratories and hospitals. You should always check with your doctor after having lab work or other tests done to discuss the meaning of your test results and whether your values are considered within normal limits. WHAT ABNORMAL RESULTS MEAN  The results of the test depend on the size of the skin reaction and on the person being tested.  A small reaction (5 mm of hard swelling at the site) is considered to be positive in people who have HIV, who are taking steroid therapy, or who have been in close contact with a person who has active tuberculosis. Larger reactions (greater than or equal to 10 mm) are considered positive in people with diabetes or kidney failure, and in health care workers, among others. In people with no known risks for tuberculosis, a positive reaction requires 15 mm or more of hard swelling at the site. RISKS AND COMPLICATIONS There is a very small risk of severe redness and swelling of the arm in people who have had a previous positive PPD test and  who have the test again. There also have been a few rare cases of this reaction in people who have not been tested before. CONSIDERATIONS  A positive skin test does not necessarily mean that a person has active tuberculosis. More tests will be done to check whether active disease is present. Many people who were born outside the [place] may have had a vaccine called "BCG," which can lead to a false-positive test result. MEANING OF TEST  Your caregiver will go over the test results with you and discuss the importance and meaning of  your results, as well as treatment options and the need for additional tests if necessary. OBTAINING THE TEST RESULTS It is your responsibility to obtain your test results. Ask the lab or department performing the test when and how you will get your results. Document Released: 09/18/2005 Document Revised: 03/02/2012 Document Reviewed: 03/18/2014 [facility] Patient Information 2015 [place], [place]. This information is not intended to replace advice given to you by your health care provider. Make sure you discuss any questions you have with your health care provider.

## 2015-01-09 NOTE — Progress Notes (Signed)
Patient ID: Shelbie Hutchingionne N Hollibaugh, female   DOB: 1986/12/07, 29 y.o.   MRN: [id number]    Subjective: CC: follow up cough/ PPD test/labs HPI: Patient is a 29 y.o. female presenting to clinic today for follow up cough/PPD test/ labs. Concerns today include:  1. Cough Patient states that cough is improving with Tessalon perles and resuming Qvar.  Using Albuterol PRN.  Has only used once.  Sleeps with humidifier, which also helps.  No fevers, n/v/d.    2. Tobacco use Has not stopped smoking/transitioned to patches yet.  When asked what has kept her from picking them up she replied, "I wanted to pick them up personally in case there was a problem with the insurance.  My husband is who picked up my cough medicine."  Patient is unsure if insurance has paid for it.  She will be going this week to pick them up.    Social History Reviewed: current smoker. FamHx and MedHx updated.  Please see EMR. Health Maintenance: UTD, PPD test today  ROS: All other systems reviewed and are negative.  Objective: Office vital signs reviewed. There were no vitals taken for this visit.  Physical Examination:  General: Awake, alert, well nourished, NAD HEENT: Normal, EOMI Pulm: CTAB, no wheezes, rhonchi or rales  Assessment: 29 y.o. female resolving cough and current tobacco use   Plan: See Problem List and After Visit Summary   [name] Kayn Haymore, DO PGY-1, [facility]

## 2015-01-09 NOTE — Assessment & Plan Note (Signed)
Has not retrieved Patches yet.  Contemplative/Action -Patient to pu Nicotine patches this week. -Will call if any problems with insurance. -Still to schedule with Dr Raymondo BandKoval in next couple of weeks.

## 2015-01-09 NOTE — Assessment & Plan Note (Signed)
TSH today. Asymptomatic

## 2015-01-09 NOTE — Addendum Note (Signed)
Addended by: Henri MedalHARTSELL, JAZMIN M on: 01/09/2015 11:20 AM   Modules accepted: Orders

## 2015-01-10 ENCOUNTER — Telehealth: Payer: Self-pay | Admitting: Family Medicine

## 2015-01-10 ENCOUNTER — Encounter: Payer: Self-pay | Admitting: Family Medicine

## 2015-01-10 NOTE — Telephone Encounter (Signed)
Attempted to contact patient regarding labs.  Elevation in LFTs was observed.  Possibly d/t use of Tylenol over the last couple of weeks.  Would like to schedule lab appointment for follow up in 6 months.    Results for orders placed or performed in visit on 01/09/15 (from the past 72 hour(s))  CBC     Status: None   Collection Time: 01/09/15 11:00 AM  Result Value Ref Range   WBC 5.4 4.0 - 10.5 K/uL   RBC 4.49 3.87 - 5.11 MIL/uL   Hemoglobin 13.1 12.0 - 15.0 g/dL   HCT 13.237.9 44.036.0 - 10.246.0 %   MCV 84.4 78.0 - 100.0 fL   MCH 29.2 26.0 - 34.0 pg   MCHC 34.6 30.0 - 36.0 g/dL   RDW 72.513.5 36.611.5 - 44.015.5 %   Platelets 284 150 - 400 K/uL   MPV 11.0 8.6 - 12.4 fL    Comment: ** Please note change in reference range(s). **  Comprehensive metabolic panel     Status: Abnormal   Collection Time: 01/09/15 11:00 AM  Result Value Ref Range   Sodium 135 135 - 145 mEq/L   Potassium 4.7 3.5 - 5.3 mEq/L   Chloride 103 96 - 112 mEq/L   CO2 26 19 - 32 mEq/L   Glucose, Bld 75 70 - 99 mg/dL   BUN 7 6 - 23 mg/dL   Creat 3.470.66 4.250.50 - 9.561.10 mg/dL   Total Bilirubin 0.6 0.2 - 1.2 mg/dL   Alkaline Phosphatase 84 39 - 117 U/L   AST 66 (H) 0 - 37 U/L   ALT 82 (H) 0 - 35 U/L   Total Protein 7.8 6.0 - 8.3 g/dL   Albumin 4.0 3.5 - 5.2 g/dL   Calcium 9.1 8.4 - 38.710.5 mg/dL  TSH     Status: None   Collection Time: 01/09/15 11:00 AM  Result Value Ref Range   TSH 0.557 0.350 - 4.500 uIU/mL  Lipid Panel     Status: Abnormal   Collection Time: 01/09/15 11:00 AM  Result Value Ref Range   Cholesterol 214 (H) 0 - 200 mg/dL    Comment: ATP III Classification:       < 200        mg/dL        Desirable      564200 - 239     mg/dL        Borderline High      >= 240        mg/dL        High      Triglycerides 115 <150 mg/dL   HDL 46 >33>39 mg/dL   Total CHOL/HDL Ratio 4.7 Ratio   VLDL 23 0 - 40 mg/dL   LDL Cholesterol 295145 (H) 0 - 99 mg/dL    Comment:   Total Cholesterol/HDL Ratio:CHD Risk                        Coronary Heart  Disease Risk Table                                        Men       Women          1/2 Average Risk              3.4  3.3              Average Risk              5.0        4.4           2X Average Risk              9.6        7.1           3X Average Risk             23.4       11.0 Use the calculated Patient Ratio above and the CHD Risk table  to determine the patient's CHD Risk. ATP III Classification (LDL):       < 100        mg/dL         Optimal      161 - 129     mg/dL         Near or Above Optimal      130 - 159     mg/dL         Borderline High      160 - 189     mg/dL         High       > 096        mg/dL         Very High       Ashly M. Nadine Counts, DO PGY-1, [facility]

## 2015-01-10 NOTE — Telephone Encounter (Signed)
Sorry she should follow up in 6 WEEKS for repeat labs not 6 months.

## 2015-01-11 ENCOUNTER — Ambulatory Visit (INDEPENDENT_AMBULATORY_CARE_PROVIDER_SITE_OTHER): Payer: Medicaid Other | Admitting: *Deleted

## 2015-01-11 ENCOUNTER — Encounter: Payer: Self-pay | Admitting: *Deleted

## 2015-01-11 DIAGNOSIS — Z111 Encounter for screening for respiratory tuberculosis: Secondary | ICD-10-CM

## 2015-01-11 DIAGNOSIS — Z7689 Persons encountering health services in other specified circumstances: Secondary | ICD-10-CM

## 2015-01-11 LAB — TB SKIN TEST
Induration: 0 mm
TB Skin Test: NEGATIVE

## 2015-01-11 NOTE — Progress Notes (Signed)
   PPD Reading Note PPD read and results entered in EpicCare. Result: 0 mm induration. Interpretation: Negative If test not read within 48-72 hours of initial placement, patient advised to repeat in other arm 1-3 weeks after this test. Allergic reaction: no  Herberta Pickron L, RN  

## 2015-02-21 ENCOUNTER — Other Ambulatory Visit: Payer: Medicaid Other

## 2015-04-20 ENCOUNTER — Ambulatory Visit (INDEPENDENT_AMBULATORY_CARE_PROVIDER_SITE_OTHER): Payer: Medicaid Other | Admitting: Family Medicine

## 2015-04-20 ENCOUNTER — Encounter: Payer: Self-pay | Admitting: Family Medicine

## 2015-04-20 VITALS — BP 111/78 | HR 84 | Temp 98.1°F | Ht 60.0 in | Wt 125.1 lb

## 2015-04-20 DIAGNOSIS — J029 Acute pharyngitis, unspecified: Secondary | ICD-10-CM

## 2015-04-20 LAB — POCT RAPID STREP A (OFFICE): Rapid Strep A Screen: NEGATIVE

## 2015-04-20 MED ORDER — HYDROCODONE-HOMATROPINE 5-1.5 MG/5ML PO SYRP: 5.0000 mL | ORAL_SOLUTION | Freq: Three times a day (TID) | ORAL | Status: DC | PRN

## 2015-04-20 MED ORDER — AMOXICILLIN 875 MG PO TABS: 875.0000 mg | ORAL_TABLET | Freq: Two times a day (BID) | ORAL | Status: DC

## 2015-04-20 NOTE — Patient Instructions (Signed)
Thank you for coming in, today!  Your strep test was negative but I want you to take an antibiotic due to the appearance of your throat and your symptoms. Take amoxicillin 875 mg twice per day for 10 days. You should start to feel better after a couple of days of antibiotics, but make sure you finish all 10 days of it.  You can take Hycodan (hydrocodone-homatropine) for throat pain. It will probably make you very sleepy, so start taking it before bed. You can take it up to a maximum of 3 times per day; take it less than that if less works. DO NOT take Hycodan and drive within about 6 hours. It may cause some itching. You can take an over-the-counter antihistamine to help with that.  Call or come back to see Dr. Nadine CountsGottschalk as you need. Please feel free to call with any questions or concerns at any time, at [phone]. --Dr. Casper HarrisonStreet

## 2015-04-20 NOTE — Progress Notes (Signed)
   Subjective:    Patient ID: Morgan Todd, female    DOB: 1986-10-18, 29 y.o.   MRN: [id number]  HPI: Pt presents to clinic for SDA for sore throat, body aches, subjective fever (sweating especially while sleeping), mild cough (none currently, some at the start of symptoms), and headaches. She has not had significant or sneezing. She has some nausea but no vomiting. These have all been present for about 3 days; no real worsening but no improvement, either. 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 She has taken Emergen-C and BC Powder; she thinks the [facility] helped her headache. She has very poor appetite and significant pain with swallowing; she is drinking more than eating. She has no change in bowel / bladder habits, SOB, or abdominal pain.  Review of Systems: As above.     Objective:   Physical Exam BP 111/78 mmHg  Pulse 84  Temp(Src) 98.1 F (36.7 C) (Oral)  Ht 5' (1.524 m)  Wt 125 lb 1.6 oz (56.745 kg)  BMI 24.43 kg/m2 Gen: ill but non-toxic-appearing adult female in NAD HEENT: [place]/AT, EOMI, PERRLA, MMM, TM's clear bilaterally  Nasal mucosae and posterior oropharynx moderately red  Bilateral tonsillar swelling / exudates noted Neck: supple, normal ROM  Few tender bilateral anterior cervical nodes, R > L Cardio: RRR, no murmur appreciated Pulm: CTAB, no wheezes, normal WOB Abd: soft, nontender, BS+ Ext: warm, well-perfused, no LE edema  Rapid strep test: NEGATIVE     Assessment & Plan:  29yo female with suspected bacterial pharyngitis despite negative strep test - Centor score 3-4 (equivocal point for cough) and with exam, favor treatment with abx - Rx for amoxicillin 875 mg BID for 10 days - Rx for Hycodan for throat pain; counseled on likely side effects and advised use of lowest dosing / frequency that is effective - otherwise  continue supportive care; push fluids until appetite returns, Tylenol for pain / fever - work note provided - f/u with PCP Dr. Nadine CountsGottschalk otherwise as needed  Note FYI to Dr. Corrie DandyGottschalk  Morgan Grassel M Salathiel Ferrara, MD PGY-3, [facility] 04/20/2015, 12:01 PM

## 2015-04-21 NOTE — Progress Notes (Signed)
I was the preceptor on the day of this visit.   Roth Ress MD  

## 2015-11-17 ENCOUNTER — Encounter (HOSPITAL_COMMUNITY): Payer: Self-pay | Admitting: Emergency Medicine

## 2015-11-17 ENCOUNTER — Emergency Department (INDEPENDENT_AMBULATORY_CARE_PROVIDER_SITE_OTHER)
Admission: EM | Admit: 2015-11-17 | Discharge: 2015-11-17 | Disposition: A | Discharge status: Home / Self Care | Payer: BC Managed Care – PPO | Source: Home / Self Care | Attending: Family Medicine | Admitting: Family Medicine

## 2015-11-17 DIAGNOSIS — J302 Other seasonal allergic rhinitis: Secondary | ICD-10-CM | POA: Diagnosis not present

## 2015-11-17 MED ORDER — IPRATROPIUM BROMIDE 0.06 % NA SOLN: 2.0000 | Freq: Four times a day (QID) | NASAL | Status: DC

## 2015-11-17 MED ORDER — DOXYCYCLINE HYCLATE 100 MG PO CAPS: 100.0000 mg | ORAL_CAPSULE | Freq: Two times a day (BID) | ORAL | Status: DC

## 2015-11-17 MED ORDER — PREDNISONE 50 MG PO TABS: ORAL_TABLET | ORAL | Status: DC

## 2015-11-17 NOTE — Discharge Instructions (Signed)
Drink plenty of fluids as discussed, use medicine as prescribed, and mucinex or delsym for cough. Return or see your doctor if further problems °

## 2015-11-17 NOTE — ED Provider Notes (Signed)
CSN: [id number]     Arrival date & time 11/17/15  1306 History   First MD Initiated Contact with Patient 11/17/15 1417     Chief Complaint  Patient presents with  . URI   (Consider location/radiation/quality/duration/timing/severity/associated sxs/prior Treatment) Patient is a 29 y.o. female presenting with URI. The history is provided by the patient.  URI Presenting symptoms: congestion, facial pain and rhinorrhea   Presenting symptoms: no fever   Severity:  Moderate Onset quality:  Gradual Duration:  1 week Progression:  Unchanged Chronicity:  New Relieved by:  Nothing Ineffective treatments:  OTC medications Associated symptoms: sinus pain and sneezing     Past Medical History  Diagnosis Date  . 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 Abnormal Pap smear   . BARTHOLIN'S CYST ABSCESS 06/07/2010    Qualifier: Diagnosis of  By: Benjamin Stainhekkekandam MD, Maisie Fushomas    . ABNORMAL PAP SMEAR, LGSIL 01/28/2011    In 2012   . Sickle cell trait (HCC)   . Asthma     prn inhaler   Past Surgical History  Procedure Laterality Date  . Cesarean section  10/30/2009    Failure to Progress  . Tooth extraction    . Cesarean section N/A 08/04/2013    Procedure: CESAREAN SECTION;  Surgeon: Lazaro ArmsLuther H Eure, MD;  Location: [facility];  Service: Obstetrics;  Laterality: N/A;   Family History  Problem Relation Age of Onset  . Sickle cell anemia Mother   . Multiple sclerosis Father   . Sickle cell anemia Maternal Grandfather   . Cervical cancer Maternal Grandmother   . Schizophrenia Maternal Uncle    Social History  Substance Use Topics  . Smoking status: Light Tobacco Smoker -- 0.10 packs/day    Last Attempt to Quit: 10/30/2012  . Smokeless tobacco: Never Used  . Alcohol Use: No   OB History    Gravida Para Term Preterm AB TAB SAB Ectopic Multiple Living   4 2 2  2  0    2     Review of Systems  Constitutional: Negative.  Negative for fever.  HENT:  Positive for congestion, postnasal drip, rhinorrhea, sinus pressure and sneezing.   Respiratory: Negative.   Cardiovascular: Negative.   All other systems reviewed and are negative.   Allergies  Review of patient's allergies indicates no known allergies.  Home Medications   Prior to Admission medications   Medication Sig Start Date End Date Taking? Authorizing Provider  loratadine (CLARITIN) 10 MG tablet Take 10 mg by mouth daily.   Yes Historical Provider, MD  phenyltoloxamine-acetaminophen 30-325 MG tablet Take 1 tablet by mouth every 4 (four) hours as needed for pain.   Yes Historical Provider, MD  albuterol (PROVENTIL HFA;VENTOLIN HFA) 108 (90 BASE) MCG/ACT inhaler Inhale 2 puffs into the lungs every 6 (six) hours as needed for wheezing or shortness of breath. 01/06/15   Ashly Hulen SkainsM Gottschalk, DO  amoxicillin (AMOXIL) 875 MG tablet Take 1 tablet (875 mg total) by mouth 2 (two) times daily. Patient not taking: Reported on 11/17/2015 04/20/15   Stephanie Couphristopher M Street, MD  beclomethasone (QVAR) 80 MCG/ACT inhaler Inhale 1 puff into the lungs 2 (two) times daily. 04/29/13   Leona SingletonMaria T Thekkekandam, MD  benzonatate (TESSALON) 100 MG capsule Take 1 capsule (100 mg total) by mouth 2 (two) times daily as needed for cough. Patient not taking: Reported on 11/17/2015 01/06/15   [name] Gottschalk, DO  doxycycline (VIBRAMYCIN) 100 MG  capsule Take 1 capsule (100 mg total) by mouth 2 (two) times daily. 11/17/15   Linna Hoff, MD  HYDROcodone-homatropine [facility]) 5-1.5 MG/5ML syrup Take 5 mLs by mouth every 8 (eight) hours as needed for cough. Patient not taking: Reported on 11/17/2015 04/20/15   Stephanie Coup Street, MD  ipratropium (ATROVENT) 0.06 % nasal spray Place 2 sprays into both nostrils 4 (four) times daily. 11/17/15   Linna Hoff, MD  nicotine (NICODERM CQ - DOSED IN MG/24 HR) 7 mg/24hr patch Place 1 patch (7 mg total) onto the skin daily. Patient not taking: Reported on 11/17/2015 01/06/15   Raliegh Ip, DO  predniSONE (DELTASONE) 50 MG tablet 1 tab daily for 2 days then 1/2 tab daily for 2 days. 11/17/15   Linna Hoff, MD  Prenatal Vit-Fe Fumarate-FA (PRENATAL MULTIVITAMIN) TABS Take 1 tablet by mouth at bedtime.     Historical Provider, MD   Meds Ordered and Administered this Visit  Medications - No data to display  BP 127/81 mmHg  Pulse 96  Temp(Src) 98.2 F (36.8 C) (Oral)  SpO2 97% No data found.   Physical Exam  Constitutional: She is oriented to person, place, and time. She appears well-developed and well-nourished. No distress.  HENT:  Right Ear: External ear normal.  Left Ear: External ear normal.  Nose: Mucosal edema and rhinorrhea present. Right sinus exhibits maxillary sinus tenderness. Left sinus exhibits maxillary sinus tenderness.  Mouth/Throat: Oropharynx is clear and moist.  Eyes: Pupils are equal, round, and reactive to light.  Neck: Normal range of motion. Neck supple.  Lymphadenopathy:    She has no cervical adenopathy.  Neurological: She is alert and oriented to person, place, and time.  Skin: Skin is warm and dry.  Nursing note and vitals reviewed.   ED Course  Procedures (including critical care time)  Labs Review Labs Reviewed - No data to display  Imaging Review No results found.   Visual Acuity Review  Right Eye Distance:   Left Eye Distance:   Bilateral Distance:    Right Eye Near:   Left Eye Near:    Bilateral Near:         MDM   1. Seasonal allergic rhinitis        Linna Hoff, MD 11/17/15 1430

## 2015-12-22 ENCOUNTER — Telehealth: Payer: Self-pay | Admitting: *Deleted

## 2015-12-22 NOTE — Telephone Encounter (Signed)
Patient called stating she thinks she has a stomach virus from her child. She woke up this morning with very running stools.  Advised patient that mostly the virus has to run it's course.  Patient denies fever, n/v.  Patient to try liquid diet and dry to keep herself and child hydrated. If they start with fever, n/v, bloody stools to go to urgent care or ED.  Patient stated understanding.  Clovis PuMartin, Panfilo Ketchum L, RN

## 2015-12-24 DIAGNOSIS — S92919A Unspecified fracture of unspecified toe(s), initial encounter for closed fracture: Secondary | ICD-10-CM

## 2015-12-24 HISTORY — DX: Unspecified fracture of unspecified toe(s), initial encounter for closed fracture: S92.919A

## 2016-02-20 ENCOUNTER — Ambulatory Visit (INDEPENDENT_AMBULATORY_CARE_PROVIDER_SITE_OTHER): Payer: BC Managed Care – PPO | Admitting: Family Medicine

## 2016-02-20 ENCOUNTER — Encounter: Payer: Self-pay | Admitting: Family Medicine

## 2016-02-20 VITALS — BP 117/72 | HR 88 | Temp 98.1°F | Ht 60.0 in | Wt 136.6 lb

## 2016-02-20 DIAGNOSIS — Z23 Encounter for immunization: Secondary | ICD-10-CM | POA: Diagnosis not present

## 2016-02-20 DIAGNOSIS — J320 Chronic maxillary sinusitis: Secondary | ICD-10-CM | POA: Diagnosis not present

## 2016-02-20 MED ORDER — FLUTICASONE PROPIONATE 50 MCG/ACT NA SUSP: 2.0000 | Freq: Every day | NASAL | Status: DC

## 2016-02-20 NOTE — Progress Notes (Signed)
    Subjective: CC: sinusitis HPI: Morgan Todd is a 30 y.o. female presenting to clinic today for office visit. Concerns today include:  1. Sinusitis She reports that she has had sinus issues since removal of wisdom teeth several months ago. She reports profuse congestion at night time.  She reports moderate amounts of mucus production, post nasal drip.  She wonders if symptoms are related to her living in an house.  No known mold exposure.  Reports extreme sinus pressure, esp behind her eyes.  She reports facial tenderness.  No tinnitus, dizziness, cough, fevers, sore throat, recent travel.  Has been on Prednisone burst, Claritin daily, Doxycycline for 7 days.  No improvement.  Has been using Atrovent nasal spray twice daily with minimal relief.  She notes that it helps with mucus production but she continues to have "chunks of stuff she blows out of her nose each morning".    Social History Reviewed: non smoker. FamHx and MedHx reviewed.  Please see EMR. Health Maintenance: Flu vaccine needed, pap due  ROS: Per HPI  Objective: Office vital signs reviewed. BP 117/72 mmHg  Pulse 88  Temp(Src) 98.1 F (36.7 C) (Oral)  Ht 5' (1.524 m)  Wt 136 lb 9.6 oz (61.961 kg)  BMI 26.68 kg/m2  Physical Examination:  General: Awake, alert, well nourished, No acute distress HEENT: +Maxillary TTP    Neck: No masses palpated. No lymphadenopathy    Ears: Tympanic membranes intact, normal light reflex, no erythema, no bulging    Eyes: PERRLA, EOMI, no pain with ocular movement    Nose: nasal turbinates moist, erythematous, edematous, no purulence or bleeding.    Throat: moist mucus membranes, no erythema Cardio: regular rate and rhythm, S1S2 heard, no murmurs appreciated Pulm: clear to auscultation bilaterally, no wheezes, rhonchi or rales, normal work of breathing on room air  Assessment/ Plan: 30 y.o. female   1. Chronic maxillary sinusitis.  No improvement after antibiotics, prednisone  burst, continued nasal atrovent and claritin use.   No evidence of infection on exam today.  Afebrile.  Well appearing. - Stop atrovent.   - Ambulatory referral to ENT - fluticasone (FLONASE) 50 MCG/ACT nasal spray; Place 2 sprays into both nostrils daily.  Dispense: 16 g; Refill: 6 - Return precautions reviewed - Could consider referral to allergist as well. - Follow up in 2 weeks for pap   Raliegh Ip, DO PGY-2, [facility]

## 2016-02-20 NOTE — Patient Instructions (Addendum)
Stop Atrovent nasal spray.  Replace with Flonase nasal spray once daily.  ENT referral placed.  Sinusitis, Adult Sinusitis is redness, soreness, and puffiness (inflammation) of the air pockets in the bones of your face (sinuses). The redness, soreness, and puffiness can cause air and mucus to get trapped in your sinuses. This can allow germs to grow and cause an infection.  HOME CARE   Drink enough fluids to keep your pee (urine) clear or pale yellow.  Use a humidifier in your home.  Run a hot shower to create steam in the bathroom. Sit in the bathroom with the door closed. Breathe in the steam 3-4 times a day.  Put a warm, moist washcloth on your face 3-4 times a day, or as told by your doctor.  Use salt water sprays (saline sprays) to wet the thick fluid in your nose. This can help the sinuses drain.  Only take medicine as told by your doctor. GET HELP RIGHT AWAY IF:   Your pain gets worse.  You have very bad headaches.  You are sick to your stomach (nauseous).  You throw up (vomit).  You are very sleepy (drowsy) all the time.  Your face is puffy (swollen).  Your vision changes.  You have a stiff neck.  You have trouble breathing. MAKE SURE YOU:   Understand these instructions.  Will watch your condition.  Will get help right away if you are not doing well or get worse.   This information is not intended to replace advice given to you by your health care provider. Make sure you discuss any questions you have with your health care provider.   Document Released: 05/27/2008 Document Revised: 12/30/2014 Document Reviewed: 07/14/2012 Elsevier Interactive Patient Education [employer]

## 2016-02-22 ENCOUNTER — Encounter: Payer: Self-pay | Admitting: Internal Medicine

## 2016-02-22 ENCOUNTER — Ambulatory Visit (INDEPENDENT_AMBULATORY_CARE_PROVIDER_SITE_OTHER): Payer: BC Managed Care – PPO | Admitting: Internal Medicine

## 2016-02-22 DIAGNOSIS — M542 Cervicalgia: Secondary | ICD-10-CM | POA: Diagnosis not present

## 2016-02-22 DIAGNOSIS — M79601 Pain in right arm: Secondary | ICD-10-CM | POA: Diagnosis not present

## 2016-02-22 MED ORDER — KETOROLAC TROMETHAMINE 30 MG/ML IJ SOLN: 30.0000 mg | Freq: Once | INTRAMUSCULAR | Status: DC

## 2016-02-22 MED ORDER — CYCLOBENZAPRINE HCL 5 MG PO TABS: 5.0000 mg | ORAL_TABLET | Freq: Three times a day (TID) | ORAL | Status: DC | PRN
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
## 2016-02-22 NOTE — Patient Instructions (Signed)
We would like to to have x-rays of your neck and right arm. Please head over to the hospital to have this done. I will call you with the results.  I have prescribed some Flexeril to help with your muscle pain. Please take  three times a day as needed. You can also take Tylenol and Ibuprofen as needed for pain.  If you have continued pain after 1 week, please come back to see [place].  -Dr. Nancy Marus

## 2016-02-23 ENCOUNTER — Ambulatory Visit (HOSPITAL_COMMUNITY)
Admission: RE | Admit: 2016-02-23 | Discharge: 2016-02-23 | Disposition: A | Discharge status: Home / Self Care | Payer: No Typology Code available for payment source | Source: Ambulatory Visit | Attending: Family Medicine | Admitting: Family Medicine

## 2016-02-23 DIAGNOSIS — M79601 Pain in right arm: Secondary | ICD-10-CM | POA: Diagnosis not present

## 2016-02-23 DIAGNOSIS — M542 Cervicalgia: Secondary | ICD-10-CM | POA: Diagnosis present

## 2016-02-23 NOTE — Progress Notes (Signed)
Spoke with Pt on the phone regarding her x-ray results. X-ray C-spine and x-ray R humerus did not show any fractures. Pt states she is feeling better today and she thinks the Toradol has helped a lot. She picked up the Flexeril prescription this morning and will start taking that today. I advised her to come back and see us if she's is not feeling better in the next 1-2 weeks.  Willadean CarolKaty Jamason Peckham, MD PGY-1

## 2016-03-12 ENCOUNTER — Encounter: Payer: BC Managed Care – PPO | Admitting: Family Medicine

## 2016-03-19 ENCOUNTER — Encounter (HOSPITAL_COMMUNITY): Payer: Self-pay | Admitting: Emergency Medicine

## 2016-03-19 ENCOUNTER — Emergency Department (HOSPITAL_COMMUNITY): Payer: BC Managed Care – PPO

## 2016-03-19 ENCOUNTER — Emergency Department (HOSPITAL_COMMUNITY)
Admission: EM | Admit: 2016-03-19 | Discharge: 2016-03-19 | Disposition: A | Discharge status: Home / Self Care | Payer: BC Managed Care – PPO | Attending: Emergency Medicine | Admitting: Emergency Medicine

## 2016-03-19 DIAGNOSIS — S99911A Unspecified injury of right ankle, initial encounter: Secondary | ICD-10-CM | POA: Diagnosis present

## 2016-03-19 DIAGNOSIS — Y92410 Unspecified street and highway as the place of occurrence of the external cause: Secondary | ICD-10-CM | POA: Insufficient documentation

## 2016-03-19 DIAGNOSIS — Z7951 Long term (current) use of inhaled steroids: Secondary | ICD-10-CM | POA: Diagnosis not present

## 2016-03-19 DIAGNOSIS — J45909 Unspecified asthma, uncomplicated: Secondary | ICD-10-CM | POA: Insufficient documentation

## 2016-03-19 DIAGNOSIS — F17200 Nicotine dependence, unspecified, uncomplicated: Secondary | ICD-10-CM | POA: Diagnosis not present

## 2016-03-19 DIAGNOSIS — Z79899 Other long term (current) drug therapy: Secondary | ICD-10-CM | POA: Insufficient documentation

## 2016-03-19 DIAGNOSIS — S199XXA Unspecified injury of neck, initial encounter: Secondary | ICD-10-CM | POA: Insufficient documentation

## 2016-03-19 DIAGNOSIS — Z862 Personal history of diseases of the blood and blood-forming organs and certain disorders involving the immune mechanism: Secondary | ICD-10-CM | POA: Insufficient documentation

## 2016-03-19 DIAGNOSIS — Y998 Other external cause status: Secondary | ICD-10-CM | POA: Diagnosis not present

## 2016-03-19 DIAGNOSIS — M25519 Pain in unspecified shoulder: Secondary | ICD-10-CM

## 2016-03-19 DIAGNOSIS — M25571 Pain in right ankle and joints of right foot: Secondary | ICD-10-CM

## 2016-03-19 DIAGNOSIS — M79674 Pain in right toe(s): Secondary | ICD-10-CM

## 2016-03-19 DIAGNOSIS — Y9389 Activity, other specified: Secondary | ICD-10-CM | POA: Insufficient documentation

## 2016-03-19 MED ORDER — IBUPROFEN 800 MG PO TABS: 800.0000 mg | ORAL_TABLET | Freq: Three times a day (TID) | ORAL | Status: DC

## 2016-03-19 MED ORDER — METHOCARBAMOL 500 MG PO TABS: 500.0000 mg | ORAL_TABLET | Freq: Two times a day (BID) | ORAL | Status: DC

## 2016-03-19 MED ORDER — METHOCARBAMOL 500 MG PO TABS
500.0000 mg | ORAL_TABLET | Freq: Once | ORAL | Status: AC
  Administered 2016-03-19: 500 mg via ORAL
  Filled 2016-03-19: qty 1

## 2016-03-19 MED ORDER — IBUPROFEN 400 MG PO TABS
800.0000 mg | ORAL_TABLET | Freq: Once | ORAL | Status: AC
  Administered 2016-03-19: 800 mg via ORAL
  Filled 2016-03-19: qty 2
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
## 2016-03-19 NOTE — Progress Notes (Signed)
Orthopedic Tech Progress Note Patient Details:  Morgan Todd May 19, 1986 [id number]  Ortho Devices Type of Ortho Device: Postop shoe/boot Ortho Device/Splint Location: RLE Ortho Device/Splint Interventions: Ordered, Application   Jennye MoccasinHughes, Ariana Cavenaugh Craig 03/19/2016, 10:13 PM
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
## 2016-04-03 ENCOUNTER — Other Ambulatory Visit (HOSPITAL_COMMUNITY)
Admission: RE | Admit: 2016-04-03 | Discharge: 2016-04-03 | Disposition: A | Discharge status: Home / Self Care | Payer: BC Managed Care – PPO | Source: Ambulatory Visit | Attending: Family Medicine | Admitting: Family Medicine

## 2016-04-03 ENCOUNTER — Ambulatory Visit (INDEPENDENT_AMBULATORY_CARE_PROVIDER_SITE_OTHER): Payer: BC Managed Care – PPO | Admitting: Family Medicine

## 2016-04-03 ENCOUNTER — Encounter: Payer: Self-pay | Admitting: Family Medicine

## 2016-04-03 VITALS — BP 130/88 | HR 83 | Temp 98.7°F | Ht 60.0 in | Wt 134.6 lb

## 2016-04-03 DIAGNOSIS — Z0000 Encounter for general adult medical examination without abnormal findings: Secondary | ICD-10-CM | POA: Diagnosis not present

## 2016-04-03 DIAGNOSIS — Z01419 Encounter for gynecological examination (general) (routine) without abnormal findings: Secondary | ICD-10-CM | POA: Diagnosis present

## 2016-04-03 DIAGNOSIS — J4520 Mild intermittent asthma, uncomplicated: Secondary | ICD-10-CM

## 2016-04-03 DIAGNOSIS — F430 Acute stress reaction: Secondary | ICD-10-CM

## 2016-04-03 DIAGNOSIS — F17200 Nicotine dependence, unspecified, uncomplicated: Secondary | ICD-10-CM

## 2016-04-03 DIAGNOSIS — Z1151 Encounter for screening for human papillomavirus (HPV): Secondary | ICD-10-CM | POA: Insufficient documentation

## 2016-04-03 DIAGNOSIS — Z113 Encounter for screening for infections with a predominantly sexual mode of transmission: Secondary | ICD-10-CM | POA: Insufficient documentation

## 2016-04-03 DIAGNOSIS — Z6826 Body mass index (BMI) 26.0-26.9, adult: Secondary | ICD-10-CM

## 2016-04-03 DIAGNOSIS — K625 Hemorrhage of anus and rectum: Secondary | ICD-10-CM

## 2016-04-03 DIAGNOSIS — Z202 Contact with and (suspected) exposure to infections with a predominantly sexual mode of transmission: Secondary | ICD-10-CM

## 2016-04-03 DIAGNOSIS — Z124 Encounter for screening for malignant neoplasm of cervix: Secondary | ICD-10-CM

## 2016-04-03 LAB — BASIC METABOLIC PANEL WITH GFR
BUN: 9 mg/dL (ref 7–25)
CALCIUM: 9.3 mg/dL (ref 8.6–10.2)
CO2: 26 mmol/L (ref 20–31)
Chloride: 105 mmol/L (ref 98–110)
Creat: 0.72 mg/dL (ref 0.50–1.10)
GFR, Est Non African American: >89 mL/min (ref >=60)
GLUCOSE: 71 mg/dL (ref 65–99)
Potassium: 3.8 mmol/L (ref 3.5–5.3)
Sodium: 138 mmol/L (ref 135–146)

## 2016-04-03 LAB — POCT WET PREP (WET MOUNT): CLUE CELLS WET PREP WHIFF POC: NEGATIVE

## 2016-04-03 LAB — LIPID PANEL
CHOLESTEROL: 186 mg/dL (ref 125–200)
HDL: 44 mg/dL — AB (ref >=46)
LDL Cholesterol: 125 mg/dL (ref <130)
TRIGLYCERIDES: 84 mg/dL (ref <150)
Total CHOL/HDL Ratio: 4.2 Ratio (ref <=5.0)
VLDL: 17 mg/dL (ref <30)

## 2016-04-03 LAB — CBC
HEMATOCRIT: 39.1 % (ref 35.0–45.0)
HEMOGLOBIN: 13.1 g/dL (ref 11.7–15.5)
MCH: 28.9 pg (ref 27.0–33.0)
MCHC: 33.5 g/dL (ref 32.0–36.0)
MCV: 86.3 fL (ref 80.0–100.0)
MPV: 10.9 fL (ref 7.5–12.5)
Platelets: 275 10*3/uL (ref 140–400)
RBC: 4.53 MIL/uL (ref 3.80–5.10)
RDW: 13.3 % (ref 11.0–15.0)
WBC: 5.5 10*3/uL (ref 3.8–10.8)

## 2016-04-03 LAB — HIV ANTIBODY (ROUTINE TESTING W REFLEX): HIV: NONREACTIVE

## 2016-04-03 MED ORDER — ALBUTEROL SULFATE HFA 108 (90 BASE) MCG/ACT IN AERS: 2.0000 | INHALATION_SPRAY | Freq: Four times a day (QID) | RESPIRATORY_TRACT | Status: DC | PRN

## 2016-04-03 NOTE — Progress Notes (Signed)
Morgan Todd is a 30 y.o. female presents to office today for annual physical exam examination.  Concerns today include:  1. Stress reaction/ anxiety She notes that she has had increased stress over the last year that has gotten worse in the last month.  She is having marital issues.  She notes that there has been talk about divorce.  She [facility].  She notes that she is trying to work things out.   She notes that she has no family here.  She cannot rely on his family because her MIL causes her stress.    Last eye exam: >1 yr Last dental exam: 2017 Last pap smear: 12/2012 normal Immunizations needed: UTD Refills needed today: Albuterol  Past Medical History  Diagnosis Date  . 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 Abnormal Pap smear   . BARTHOLIN'S CYST ABSCESS 06/07/2010    Qualifier: Diagnosis of  By: Morgan Stainhekkekandam MD, Morgan Todd    . ABNORMAL PAP SMEAR, LGSIL 01/28/2011    In 2012   . Sickle cell trait (HCC)   . Asthma     prn inhaler   Social History   Social History  . Marital Status: Married    Spouse Name: N/A  . Number of Children: N/A  . Years of Education: N/A   Occupational History  . Not on file.   Social History Main Topics  . Smoking status: Light Tobacco Smoker -- 0.10 packs/day    Last Attempt to Quit: 10/30/2012  . Smokeless tobacco: Never Used  . Alcohol Use: No  . Drug Use: No  . Sexual Activity: Yes   Other Topics Concern  . Not on file   Social History Narrative   Works at [employer]  Plans to continue working through pregnancy   Lives with Husband and Son Morgan Todd(Morgan Todd)   Past Surgical History  Procedure Laterality Date  . Cesarean section  10/30/2009    Failure to Progress  . Tooth extraction    . Cesarean section N/A 08/04/2013    Procedure: CESAREAN SECTION;  Surgeon: Morgan ArmsLuther H Eure, MD;  Location: [facility];  Service:  Obstetrics;  Laterality: N/A;   Family History  Problem Relation Age of Onset  . Sickle cell anemia Mother   . Multiple sclerosis Father   . Sickle cell anemia Maternal Grandfather   . Cervical cancer Maternal Grandmother   . Schizophrenia Maternal Uncle     ROS: Review of Systems Constitutional: negative Eyes: negative Ears, nose, mouth, throat, and face: negative Respiratory: positive for asthma Cardiovascular: positive for palpitations Gastrointestinal: positive for occ scant bleeding with stooling.  no pruritis, no rectal pain, no constipation/ diarrhea Genitourinary:positive for possible exposure to STD.  denies pruritis or discharge.  no menstrual cycles on nexplanon Integument/breast: negative Hematologic/lymphatic: negative Musculoskeletal:negative Neurological: negative Behavioral/Psych: positive for anxiety, loss of interest in favorite activities, tobacco use and increased stress Endocrine: negative Allergic/Immunologic: positive for seasonal allergies   Physical exam BP 130/88 mmHg  Pulse 83  Temp(Src) 98.7 F (37.1 C) (Oral)  Ht 5' (1.524 m)  Wt 134 lb 9.6 oz (61.054 kg)  BMI 26.29 kg/m2 General appearance: alert, cooperative, appears stated age and no distress Head: Normocephalic, without obvious abnormality, atraumatic Eyes: conjunctivae/corneas clear. PERRL, EOM's intact. Fundi benign. Ears: normal TM's and external ear canals both ears Nose: Nares normal. Septum midline.  Mucosa normal. No drainage or sinus tenderness. Throat: lips, mucosa, and tongue normal; teeth and gums normal Neck: no adenopathy, supple, symmetrical, trachea midline and thyroid not enlarged, symmetric, no tenderness/mass/nodules Back: symmetric, no curvature. ROM normal. No CVA tenderness. Lungs: clear to auscultation bilaterally Breasts: normal appearance, no masses or tenderness Heart: regular rate and rhythm, S1, S2 normal, no murmur, click, rub or gallop Abdomen: soft,  non-tender; bowel sounds normal; no masses,  no organomegaly Pelvic: cervix normal in appearance, external genitalia normal, no adnexal masses or tenderness, no cervical motion tenderness, rectovaginal septum normal, uterus normal size, shape, and consistency and moderate leukorrhea from os.  no punctate lesions or bleeding Extremities: extremities normal, atraumatic, no cyanosis or edema Pulses: 2+ and symmetric Skin: Skin color, texture, turgor normal. No rashes or lesions Lymph nodes: Cervical, supraclavicular, and axillary nodes normal. Neurologic: Alert and oriented X 3, normal strength and tone. Normal symmetric reflexes. Normal coordination and gait    Assessment/ Plan: Patient here for annual physical exam.   1. Annual physical exam - CBC, CMP, TSH ordered  2. Asthma, chronic, mild intermittent, uncomplicated - well controlled.  Flares with season changes. - on claritin prn as well - albuterol (PROVENTIL HFA;VENTOLIN HFA) 108 (90 Base) MCG/ACT inhaler; Inhale 2 puffs into the lungs every 6 (six) hours as needed for wheezing or shortness of breath.  Dispense: 1 Inhaler; Refill: 1  3. Tobacco use disorder - recommended using nicotine gum for cessation, as she is smoking 2-3 cigs per day and a patch would be > her total intake of nicotine  4. Possible exposure to STD.  Negative wet prep. - POCT Wet Prep ([place]) - HIV antibody (with reflex) - RPR  5. Screening for cervical cancer - Cytology - PAP w/ HPV and GC/CT testing - Will contact with results  6. BMI 26.0-26.9,adult - BASIC METABOLIC PANEL WITH GFR - TSH - Lipid panel  7. Rectal bleeding.  No abnormalities on today's exam.  No h/o of colon cancer in family.  No ovarian or breast ca. - CBC - Stool softener - Will consider anoscopy/ referral to GI if continued symptoms - Return precautions reviewed.  8. Stress reaction.  Patient going through tough situation at home with possible separation from her husband. [facility]  9 score 8.  Wishes for counseling.  Does not want SSRI at this time. No SI/HI - List of counseling/ psychiatric services provided - Return precautions reviewed.  Morgan M. Nadine Counts, DO PGY-2, [facility]

## 2016-04-03 NOTE — Patient Instructions (Addendum)
I will contact you will the results of your labs.  If anything is abnormal, I will call you.  Otherwise, expect a copy to be mailed to you.  Consider purchasing nicotine gum to help you stop smoking.  Your rectum looked normal.  I want you to purchase a stool softener like Colace and take daily to see if this helps.  If you continue to have blood in your stool, come back and see me and we will send you to a GI doctor for further evaluation.

## 2016-04-04 LAB — RPR

## 2016-04-04 LAB — TSH: TSH: 1.42 mIU/L

## 2016-04-08 ENCOUNTER — Encounter: Payer: Self-pay | Admitting: Family Medicine

## 2016-04-08 LAB — CYTOLOGY - PAP

## 2016-05-30 ENCOUNTER — Telehealth: Payer: Self-pay | Admitting: Family Medicine

## 2016-05-30 NOTE — Telephone Encounter (Signed)
Pt has decided she would like anxiety medication.  She uses [facility] [address]

## 2016-05-30 NOTE — Telephone Encounter (Signed)
Please have her schedule appointment to be seen.

## 2016-06-03 ENCOUNTER — Other Ambulatory Visit: Payer: Self-pay | Admitting: Family Medicine

## 2016-06-03 MED ORDER — ESCITALOPRAM OXALATE 10 MG PO TABS: 10.0000 mg | ORAL_TABLET | Freq: Every day | ORAL | Status: DC

## 2016-06-03 NOTE — Telephone Encounter (Signed)
Spoke to patient on phone.  We actually discussed SSRI at last appt.  I have sent in Lexapro 10mg .  Patient to schedule f/u appt in 2-3 weeks.  Return precautions and side effects discussed

## 2016-06-20 ENCOUNTER — Ambulatory Visit (INDEPENDENT_AMBULATORY_CARE_PROVIDER_SITE_OTHER): Payer: BC Managed Care – PPO | Admitting: Family Medicine

## 2016-06-20 VITALS — BP 119/74 | HR 84 | Temp 98.4°F | Ht 60.0 in | Wt 130.2 lb

## 2016-06-20 DIAGNOSIS — Z3046 Encounter for surveillance of implantable subdermal contraceptive: Secondary | ICD-10-CM | POA: Diagnosis not present

## 2016-06-20 DIAGNOSIS — Z3040 Encounter for surveillance of contraceptives, unspecified: Secondary | ICD-10-CM | POA: Diagnosis not present

## 2016-06-20 DIAGNOSIS — Z30017 Encounter for initial prescription of implantable subdermal contraceptive: Secondary | ICD-10-CM | POA: Diagnosis not present

## 2016-06-20 LAB — POCT URINE PREGNANCY: Preg Test, Ur: NEGATIVE

## 2016-06-20 MED ORDER — ETONOGESTREL 68 MG ~~LOC~~ IMPL
68.0000 mg | DRUG_IMPLANT | Freq: Once | SUBCUTANEOUS | Status: AC
  Administered 2016-06-20: 68 mg via SUBCUTANEOUS

## 2016-06-20 NOTE — Addendum Note (Signed)
Addended by: Jone BasemanFLEEGER, JESSICA D on: 06/20/2016 10:49 AM   Modules accepted: Orders

## 2016-06-20 NOTE — Progress Notes (Signed)
Progestin Implant Removal Note (PATIENT NAME) is here for removal and reinsertion of her etonogestrel rod implant (Nexplanon). She would like it removed because of: she is pass due for removal, couple of weeks up to months due for removal.  An informed consent was taken prior to removal and is to be scanned into the Electronic Health Record.  Risks of the procedure include: bleeding, infection, difficulty with removal, scarring and nerve damage. There may be bruising at the site of incision and down the arm.  Procedure Note: Time out taken: 8:50am  Team: Drs. Lum BabeEniola and [place]. Dorisann Framesamika Martin and Ramona  (PATIENT NAME) (PATIENT DOB) confirmed (YES)  Procedure: Progestin Implant Removal and reinsertion  Procedure confirmed by patient and team (YES)  Side: (LEFT)  Position correct for procedure (YES)  Equipment for procedure available (YES)  The patient is place in the supine position. Aseptic conditions are maintained. The rod is located by palpation. The area is cleaned with antiseptic. 5 cc of 1% lidocaine with epinephrine is injected just underneath the end of the implant closest to the elbow. After firmly pressing down on the end of the implant closer to the axilla a 2-3 mm incision is made with a scalpel. The rod is pushed to the incision site and grasped with a mosquito forceps and gently removed. Blunt dissection (WAS) needed. The patient (DID) tolerate the procedure well. The rod was removed in its entirety. A new Nexplanon device was then reinserted through the same incision site by Dr. Natale MilchLancaster.  The incision was dressed with a small adhesive bandage closure and a pressure dressing was applied.    Janit PaganKehinde Kylle Lall, MD, MPH

## 2016-06-20 NOTE — Patient Instructions (Signed)
It was nice seeing your. Your Nexplanon insertion and removal went well. Please see us soon if having any pain or discharge from the incision site.  Laceration Care, Adult A laceration is a cut that goes through all layers of the skin. The cut also goes into the tissue that is right under the skin. Some cuts heal on their own. Others need to be closed with stitches (sutures), staples, skin adhesive strips, or wound glue. Taking care of your cut lowers your risk of infection and helps your cut to heal better. HOW TO TAKE CARE OF YOUR CUT For stitches or staples:  Keep the wound clean and dry.  If you were given a bandage (dressing), you should change it at least one time per day or as told by your doctor. You should also change it if it gets wet or dirty.  Keep the wound completely dry for the first 24 hours or as told by your doctor. After that time, you may take a shower or a bath. However, make sure that the wound is not soaked in water until after the stitches or staples have been removed.  Clean the wound one time each day or as told by your doctor:  Wash the wound with soap and water.  Rinse the wound with water until all of the soap comes off.  Pat the wound dry with a clean towel. Do not rub the wound.  After you clean the wound, put a thin layer of antibiotic ointment on it as told by your doctor. This ointment:  Helps to prevent infection.  Keeps the bandage from sticking to the wound.  Have your stitches or staples removed as told by your doctor. If your doctor used skin adhesive strips:   Keep the wound clean and dry.  If you were given a bandage, you should change it at least one time per day or as told by your doctor. You should also change it if it gets dirty or wet.  Do not get the skin adhesive strips wet. You can take a shower or a bath, but be careful to keep the wound dry.  If the wound gets wet, pat it dry with a clean towel. Do not rub the wound.  Skin  adhesive strips fall off on their own. You can trim the strips as the wound heals. Do not remove any strips that are still stuck to the wound. They will fall off after a while. If your doctor used wound glue:  Try to keep your wound dry, but you may briefly wet it in the shower or bath. Do not soak the wound in water, such as by swimming.  After you take a shower or a bath, gently pat the wound dry with a clean towel. Do not rub the wound.  Do not do any activities that will make you really sweaty until the skin glue has fallen off on its own.  Do not apply liquid, cream, or ointment medicine to your wound while the skin glue is still on.  If you were given a bandage, you should change it at least one time per day or as told by your doctor. You should also change it if it gets dirty or wet.  If a bandage is placed over the wound, do not let the tape for the bandage touch the skin glue.  Do not pick at the glue. The skin glue usually stays on for 5-10 days. Then, it falls off of the skin. General  Instructions  To help prevent scarring, make sure to cover your wound with sunscreen whenever you are outside after stitches are removed, after adhesive strips are removed, or when wound glue stays in place and the wound is healed. Make sure to wear a sunscreen of at least 30 SPF.  Take over-the-counter and prescription medicines only as told by your doctor.  If you were given antibiotic medicine or ointment, take or apply it as told by your doctor. Do not stop using the antibiotic even if your wound is getting better.  Do not scratch or pick at the wound.  Keep all follow-up visits as told by your doctor. This is important.  Check your wound every day for signs of infection. Watch for:  Redness, swelling, or pain.  Fluid, blood, or pus.  Raise (elevate) the injured area above the level of your heart while you are sitting or lying down, if possible. GET HELP IF:  You got a tetanus shot  and you have any of these problems at the injection site:  Swelling.  Very bad pain.  Redness.  Bleeding.  You have a fever.  A wound that was closed breaks open.  You notice a bad smell coming from your wound or your bandage.  You notice something coming out of the wound, such as wood or glass.  Medicine does not help your pain.  You have more redness, swelling, or pain at the site of your wound.  You have fluid, blood, or pus coming from your wound.  You notice a change in the color of your skin near your wound.  You need to change the bandage often because fluid, blood, or pus is coming from the wound.  You start to have a new rash.  You start to have numbness around the wound. GET HELP RIGHT AWAY IF:  You have very bad swelling around the wound.  Your pain suddenly gets worse and is very bad.  You notice painful lumps near the wound or on skin that is anywhere on your body.  You have a red streak going away from your wound.  The wound is on your hand or foot and you cannot move a finger or toe like you usually can.  The wound is on your hand or foot and you notice that your fingers or toes look pale or bluish.   This information is not intended to replace advice given to you by your health care provider. Make sure you discuss any questions you have with your health care provider.   Document Released: 05/27/2008 Document Revised: 04/25/2015 Document Reviewed: 12/05/2014 Elsevier Interactive Patient Education [employer]2016 [employer]

## 2016-07-04 ENCOUNTER — Other Ambulatory Visit: Payer: Self-pay | Admitting: Family Medicine

## 2016-07-04 NOTE — Telephone Encounter (Signed)
Spoke to patient on phone.  She is doing well.  No concerns.  Tolerating current dose of medication.  She is scheduled for f/u on 8/7 w/ me @ 4:15pm. Lexapro refilled x1 month

## 2016-07-22 ENCOUNTER — Ambulatory Visit (HOSPITAL_COMMUNITY)
Admission: EM | Admit: 2016-07-22 | Discharge: 2016-07-22 | Disposition: A | Discharge status: Home / Self Care | Payer: BC Managed Care – PPO | Attending: Family Medicine | Admitting: Family Medicine

## 2016-07-22 ENCOUNTER — Encounter (HOSPITAL_COMMUNITY): Payer: Self-pay | Admitting: Emergency Medicine

## 2016-07-22 ENCOUNTER — Ambulatory Visit (HOSPITAL_COMMUNITY): Payer: BC Managed Care – PPO

## 2016-07-22 DIAGNOSIS — W228XXA Striking against or struck by other objects, initial encounter: Secondary | ICD-10-CM | POA: Diagnosis not present

## 2016-07-22 DIAGNOSIS — S92422A Displaced fracture of distal phalanx of left great toe, initial encounter for closed fracture: Secondary | ICD-10-CM | POA: Insufficient documentation

## 2016-07-22 DIAGNOSIS — R52 Pain, unspecified: Secondary | ICD-10-CM

## 2016-07-22 DIAGNOSIS — S92402A Displaced unspecified fracture of left great toe, initial encounter for closed fracture: Secondary | ICD-10-CM

## 2016-07-22 DIAGNOSIS — M79672 Pain in left foot: Secondary | ICD-10-CM | POA: Diagnosis present

## 2016-07-22 MED ORDER — HYDROCODONE-ACETAMINOPHEN 5-325 MG PO TABS: 2.0000 | ORAL_TABLET | ORAL | 0 refills | Status: DC | PRN

## 2016-07-22 NOTE — ED Provider Notes (Signed)
CSN: [id number]     Arrival date & time 07/22/16  1454 History   None    Chief Complaint  Patient presents with  . Foot Pain   (Consider location/radiation/quality/duration/timing/severity/associated sxs/prior Treatment) Patient had dropped a trophy on her left great toe and now it is painful.   The history is provided by the patient.  Toe Pain  This is a new problem. The current episode started 1 to 2 hours ago. The problem occurs constantly. The problem has not changed since onset.The symptoms are aggravated by walking. The symptoms are relieved by relaxation. She has tried nothing for the symptoms.    Past Medical History:  Diagnosis Date  . Abnormal Pap smear   . ABNORMAL PAP SMEAR, LGSIL 01/28/2011   In 2012   . Asthma    prn inhaler  . BARTHOLIN'S CYST ABSCESS 06/07/2010   Qualifier: Diagnosis of  By: Benjamin Stain MD, Maisie Fus    . 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 Sickle cell trait [facility])    Past Surgical History:  Procedure Laterality Date  . CESAREAN SECTION  10/30/2009   Failure to Progress  . CESAREAN SECTION N/A 08/04/2013   Procedure: CESAREAN SECTION;  Surgeon: Lazaro Arms, MD;  Location: [facility];  Service: Obstetrics;  Laterality: N/A;  . TOOTH EXTRACTION     Family History  Problem Relation Age of Onset  . Sickle cell anemia Mother   . Stroke Mother 65  . Multiple sclerosis Father   . Diabetes Father   . Sickle cell anemia Maternal Grandfather   . Prostate cancer Maternal Grandfather 90  . Cervical cancer Maternal Grandmother 28  . Schizophrenia Maternal Uncle    Social History  Substance Use Topics  . Smoking status: Light Tobacco Smoker    Packs/day: 0.10    Last attempt to quit: 10/30/2012  . Smokeless tobacco: Never Used  . Alcohol use No   OB History    Gravida Para Term Preterm AB Living   SAB TAB Ectopic Multiple Live Births     0           Review of Systems   Constitutional: Negative.   HENT: Negative.   Eyes: Negative.   Respiratory: Negative.   Cardiovascular: Negative.   Gastrointestinal: Negative.   Endocrine: Negative.   Genitourinary: Negative.   Musculoskeletal: Positive for arthralgias.  Skin: Negative.   Allergic/Immunologic: Negative.   Neurological: Negative.   Hematological: Negative.   Psychiatric/Behavioral: Negative.     Allergies  Review of patient's allergies indicates no known allergies.  Home Medications   Prior to Admission medications   Medication Sig Start Date End Date Taking? Authorizing Provider  albuterol (PROVENTIL HFA;VENTOLIN HFA) 108 (90 Base) MCG/ACT inhaler Inhale 2 puffs into the lungs every 6 (six) hours as needed for wheezing or shortness of breath. 04/03/16   Ashly Hulen Skains, DO  escitalopram (LEXAPRO) 10 MG tablet TAKE 1 TABLET EVERY DAY 07/04/16   Ashly Hulen Skains, DO  fluticasone (FLONASE) 50 MCG/ACT nasal spray Place 2 sprays into both nostrils daily. 02/20/16   Ashly Hulen Skains, DO  ibuprofen (ADVIL,MOTRIN) 800 MG tablet Take 1 tablet (800 mg total) by mouth 3 (three) times daily. 03/19/16   Mady Gemma, PA-C  loratadine (CLARITIN) 10 MG tablet Take 10 mg by mouth daily.    Historical Provider, MD  methocarbamol (ROBAXIN) 500 MG tablet Take 1 tablet (  500 mg total) by mouth 2 (two) times daily. 03/19/16   Mady Gemma, PA-C   Meds Ordered and Administered this Visit  Medications - No data to display  There were no vitals taken for this visit. No data found.   Physical Exam  Constitutional: She appears well-developed and well-nourished.  HENT:  Head: Normocephalic and atraumatic.  Eyes: EOM are normal. Pupils are equal, round, and reactive to light.  Cardiovascular: Normal rate, regular rhythm and normal heart sounds.   Pulmonary/Chest: Effort normal and breath sounds normal.  Musculoskeletal: She exhibits edema and tenderness.  Left great toe is tender and swollen     Urgent Care Course   Clinical Course    Procedures (including critical care time)  Labs Review Labs Reviewed - No data to display  Imaging Review Dg Toe Great Left  Result Date: 07/22/2016 CLINICAL DATA:  Central left great toe pain after injury today. EXAM: LEFT GREAT TOE COMPARISON:  None. FINDINGS: Slightly displaced fracture within the proximal portion of the distal phalanx. No evidence of fracture extension to the proximal articular surface. Alignment at the adjacent DIP joint is normal. IMPRESSION: Slightly displaced fracture of the distal phalanx. Electronically Signed   By: Bary Richard M.D.   On: 07/22/2016 17:00     Visual Acuity Review  Right Eye Distance:   Left Eye Distance:   Bilateral Distance:    Right Eye Near:   Left Eye Near:    Bilateral Near:         MDM  Fractured left first distal toe - Norco 5/325 one po q 6 hours prn #6 Post op boot, Follow up with Dr. [facility] call for an appointment Tomorrow.      Deatra Canter, FNP 07/22/16 1721    Deatra Canter, FNP 07/22/16 [phone]

## 2016-07-22 NOTE — ED Triage Notes (Signed)
Patient reports a trophy was dropped on left great toe .  Patient complains of pain

## 2016-07-22 NOTE — ED Notes (Signed)
Ready for xray.

## 2016-07-29 ENCOUNTER — Encounter: Payer: Self-pay | Admitting: Family Medicine

## 2016-07-29 ENCOUNTER — Ambulatory Visit (INDEPENDENT_AMBULATORY_CARE_PROVIDER_SITE_OTHER): Payer: BC Managed Care – PPO | Admitting: Family Medicine

## 2016-07-29 VITALS — BP 113/92 | HR 85 | Temp 98.4°F | Ht 60.0 in | Wt 125.0 lb

## 2016-07-29 DIAGNOSIS — F17200 Nicotine dependence, unspecified, uncomplicated: Secondary | ICD-10-CM | POA: Diagnosis not present

## 2016-07-29 DIAGNOSIS — F32A Depression, unspecified: Secondary | ICD-10-CM

## 2016-07-29 DIAGNOSIS — Z7289 Other problems related to lifestyle: Secondary | ICD-10-CM

## 2016-07-29 DIAGNOSIS — F329 Major depressive disorder, single episode, unspecified: Secondary | ICD-10-CM | POA: Diagnosis not present

## 2016-07-29 DIAGNOSIS — Z609 Problem related to social environment, unspecified: Secondary | ICD-10-CM

## 2016-07-29 MED ORDER — ESCITALOPRAM OXALATE 20 MG PO TABS: 20.0000 mg | ORAL_TABLET | Freq: Every day | ORAL | 2 refills | Status: DC

## 2016-07-29 NOTE — Patient Instructions (Signed)
I have increased your Lexapro to 20mg  daily.  I am sending your information to Sammuel Hineseborah [place], our in house social worker.  She will be contacting you sometime this week.  Our in house counselor's information is [phone].  They are available Monday (until 12pm)- Friday (until 4pm).  Call them to set up an appointment.  I will copy your chart to them as well.  Feel free to call me anytime.  See me within the next couple of months or sooner if needed.
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
## 2016-07-30 ENCOUNTER — Telehealth: Payer: Self-pay | Admitting: Licensed Clinical Social Worker
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
## 2016-08-28 ENCOUNTER — Encounter (HOSPITAL_COMMUNITY): Payer: Self-pay

## 2016-08-28 ENCOUNTER — Emergency Department (HOSPITAL_COMMUNITY): Payer: BC Managed Care – PPO

## 2016-08-28 ENCOUNTER — Emergency Department (HOSPITAL_COMMUNITY)
Admission: EM | Admit: 2016-08-28 | Discharge: 2016-08-28 | Disposition: A | Discharge status: Home / Self Care | Payer: BC Managed Care – PPO | Attending: Physician Assistant | Admitting: Physician Assistant

## 2016-08-28 DIAGNOSIS — F17200 Nicotine dependence, unspecified, uncomplicated: Secondary | ICD-10-CM | POA: Diagnosis not present

## 2016-08-28 DIAGNOSIS — N750 Cyst of Bartholin's gland: Secondary | ICD-10-CM | POA: Diagnosis not present

## 2016-08-28 DIAGNOSIS — R229 Localized swelling, mass and lump, unspecified: Secondary | ICD-10-CM | POA: Diagnosis present

## 2016-08-28 DIAGNOSIS — J45909 Unspecified asthma, uncomplicated: Secondary | ICD-10-CM | POA: Insufficient documentation

## 2016-08-28 DIAGNOSIS — Z79899 Other long term (current) drug therapy: Secondary | ICD-10-CM | POA: Insufficient documentation

## 2016-08-28 MED ORDER — IBUPROFEN 600 MG PO TABS: 600.0000 mg | ORAL_TABLET | Freq: Four times a day (QID) | ORAL | 0 refills | Status: DC | PRN
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
## 2016-08-28 NOTE — ED Notes (Signed)
Patient transported to US 

## 2016-08-28 NOTE — Discharge Instructions (Signed)
Take your medication as prescribed as needed for pain relief. I also recommend applying ice/heat to affected area for 15 minutes 3-4 times daily to help with pain. Follow-up with the women's clinic listed above in the next week if your symptoms have not improved. Please follow up with a primary care provider from the Resource Guide provided below in fever, abdominal pain, drainage, worsening swelling or redness, vaginal bleeding, vaginal discharge, pain or difficulty urinating.

## 2016-08-28 NOTE — ED Triage Notes (Signed)
Pt presents with 2 day h/o R labial abscess.  Pt reports attempting to squeeze area but denies any drainage to area.

## 2016-09-03 ENCOUNTER — Encounter: Payer: Self-pay | Admitting: Obstetrics and Gynecology

## 2016-09-03 ENCOUNTER — Ambulatory Visit (INDEPENDENT_AMBULATORY_CARE_PROVIDER_SITE_OTHER): Payer: BC Managed Care – PPO | Admitting: Obstetrics and Gynecology

## 2016-09-03 VITALS — BP 133/78 | HR 78 | Wt 120.0 lb

## 2016-09-03 DIAGNOSIS — N750 Cyst of Bartholin's gland: Secondary | ICD-10-CM | POA: Diagnosis not present

## 2016-09-03 HISTORY — DX: Cyst of Bartholin's gland: N75.0

## 2016-09-03 LAB — POCT URINALYSIS DIP (DEVICE)
Bilirubin Urine: NEGATIVE
Glucose, UA: NEGATIVE mg/dL
Hgb urine dipstick: NEGATIVE
Ketones, ur: NEGATIVE mg/dL
Leukocytes, UA: NEGATIVE
NITRITE: NEGATIVE
PH: 6 (ref 5.0–8.0)
PROTEIN: NEGATIVE mg/dL
Specific Gravity, Urine: 1.015 (ref 1.005–1.030)
UROBILINOGEN UA: 0.2 mg/dL (ref 0.0–1.0)
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
## 2016-09-03 NOTE — Procedures (Signed)
Word Catheter Procedure Note  Pre-operative Diagnosis: Right sided bartholin's gland cyst (not improving with expectant management)  Post-operative Diagnosis: same, s/p I&D, irrigation and word catheter placement  Procedure Details:  The risks (including infection, bleeding, pain, etc) and benefits of the procedure were explained to the patient and Written informed consent was obtained.  The patient was placed in the dorsal lithotomy position. The introitus and distal vaginal were prepped with betadine and alcohol after an exam.  The cyst perimeter was then injected with 3mL of 1% lidocaine with epinephrine.  The introitus and labia were then prepped with betadine. Next, sterile gloves were worn and the cyst, at the inner aspect in the vagina, was punctured with an 11 blade and grey-white-yellow fluid was seen coming from the cyst; this fluid was swabbed and sent for GC/CT.  Next the area was then copious irrigated and a hemostat placed in the cyst and loculations were broken up in a 360 degree manner and the cyst irrigated with 100mL of sterile saline. Approximately 20mL of cyst fluid was removed from the gland. A word catheter was then placed and inflated with 3.635mL of sterile saline and the tugged and noted to stay in place; the tail was then tucked in the vagina.   She tolerated the procedure great.  Condition: Stable  Complications: None  Plan: The patient was advised to call for any fever or for prolonged or severe pain or bleeding and regular post op instructions given. She was advised to use OTC analgesics as needed for mild to moderate pain. She was advised to avoid vaginal intercourse until seen for follow up and call to let us know if the catheter falls out.   Cornelia Copaharlie Zae Kirtz, Jr MD Attending [facility](Faculty Practice)

## 2016-09-04 LAB — GC/CHLAMYDIA PROBE AMP (~~LOC~~) NOT AT ARMC
CHLAMYDIA, DNA PROBE: NEGATIVE
Neisseria Gonorrhea: NEGATIVE

## 2016-09-25 ENCOUNTER — Encounter: Payer: Self-pay | Admitting: Obstetrics and Gynecology

## 2016-09-25 ENCOUNTER — Ambulatory Visit (INDEPENDENT_AMBULATORY_CARE_PROVIDER_SITE_OTHER): Payer: BC Managed Care – PPO | Admitting: Obstetrics and Gynecology

## 2016-09-25 VITALS — BP 123/79 | HR 85 | Ht 60.0 in | Wt 117.1 lb

## 2016-09-25 DIAGNOSIS — N750 Cyst of Bartholin's gland: Secondary | ICD-10-CM

## 2016-09-25 NOTE — Progress Notes (Addendum)
GYN Note 09/25/2016 [facility]  CC: Word catheter removal HPI: see prior note. Right sided bartholin's drained and WC placed on 9/12. Gc/ct negative. Pt with no issues and doing well  123/79 85 117lbs, BMI 22.8 0/10 pain  NAD EGBUS normal. Right sided WC deflated with needle and syringe and removed intact. Defect normal and no e/o infection or discharge and nttp  A/p: pt doing well Pt told to call us if s/s recur for office visit Pelvic rest x 10d  RTC prn  Cornelia Copaharlie Kirubel Aja, Jr MD Attending [facility](Faculty Practice)

## 2017-07-03 ENCOUNTER — Encounter: Payer: Self-pay | Admitting: Family Medicine

## 2017-07-03 ENCOUNTER — Ambulatory Visit (INDEPENDENT_AMBULATORY_CARE_PROVIDER_SITE_OTHER): Payer: BC Managed Care – PPO | Admitting: Family Medicine

## 2017-07-03 VITALS — BP 100/72 | HR 66 | Temp 98.2°F | Ht 60.0 in | Wt 116.0 lb

## 2017-07-03 DIAGNOSIS — B354 Tinea corporis: Secondary | ICD-10-CM | POA: Diagnosis not present

## 2017-07-03 MED ORDER — GRISEOFULVIN ULTRAMICROSIZE 250 MG PO TABS: 250.0000 mg | ORAL_TABLET | Freq: Two times a day (BID) | ORAL | 0 refills | Status: DC

## 2017-07-03 NOTE — Progress Notes (Signed)
Subjective:     Morgan HutchingDionne N Hepburn is a 31 y.o. female who was referred to me for evaluation and treatment of probable tinea capitis. Itching area is located on the head. Symptoms include itching with what she describes as a "rash" located on head and on the right occipital region behind ear. . Symptoms have been ongoing for about 2 days. Both her two sons have been diagnosed and currently being treated with tinea corporis.   The following portions of the patient's history were reviewed and updated as appropriate: past medical history and problem list.   Review of Systems Pertinent items are noted in HPI.   Objective:    Physical Exam Locations:  righ occipital area behind ear  Description:   Slight scaling, no distinct lesion, visually obscured by thick matted hair weave   Lesion size:  0.5 mm  Other Findings:  none  KOH:   Not performed due to difficulty to reach under patient weave   Woods lamp:   same as KOH    Assessment and Plan:   Dermatophytosis of body  Probable tinea capitis given contact with two sons. No distinct lesion noted. Did not perform KOH due to difficult of testing itching area under hair weave. Will treat based on history and known contact. Pt has no h/o liver dysfunction. Labs in 03/2016 showed no sign of liver or renal impairment.  1. gresiofulvin orally for 2 weeks.   2. Written patient instruction given.  3. Follow up as needed for acute illness.   Thomes DinningBrad Thompson, MD, MS FAMILY MEDICINE RESIDENT - PGY1 07/03/2017 10:52 AM

## 2017-07-03 NOTE — Patient Instructions (Addendum)
It was a pleasure to see you today! Thank you for choosing [facility] for your primary care. Morgan Todd was seen for tinea capitis. Take griseofulvin twice a day for 2 weeks.  Come back to the clinic if If symptoms worsen or as needed.   Best,  Thomes DinningBrad Thompson, MD, MS FAMILY MEDICINE RESIDENT - PGY1 07/03/2017 10:39 AM Take medicine 2 times a day for

## 2017-07-03 NOTE — Assessment & Plan Note (Addendum)
Probable tinea capitis given contact with two sons. No distinct lesion noted. Did not perform KOH due to difficult of testing itching area under hair weave. Will treat based on history and known contact. Pt has no h/o liver dysfunction. Labs in 03/2016 showed no sign of liver or renal impairment.  1. gresiofulvin orally for 2 weeks.   2. Written patient instruction given.  3. Follow up as needed for acute illness.

## 2018-05-25 ENCOUNTER — Encounter: Payer: Self-pay | Admitting: Internal Medicine

## 2018-05-25 ENCOUNTER — Other Ambulatory Visit: Payer: Self-pay

## 2018-05-25 ENCOUNTER — Ambulatory Visit (INDEPENDENT_AMBULATORY_CARE_PROVIDER_SITE_OTHER): Payer: BC Managed Care – PPO | Admitting: Internal Medicine

## 2018-05-25 DIAGNOSIS — S134XXA Sprain of ligaments of cervical spine, initial encounter: Secondary | ICD-10-CM | POA: Diagnosis not present

## 2018-05-25 DIAGNOSIS — S93401A Sprain of unspecified ligament of right ankle, initial encounter: Secondary | ICD-10-CM

## 2018-05-25 HISTORY — DX: Sprain of unspecified ligament of right ankle, initial encounter: S93.401A

## 2018-05-25 NOTE — Patient Instructions (Addendum)
Cervical Sprain A cervical sprain is a stretch or tear in the tissues that connect bones (ligaments) in the neck. Most neck (cervical) sprains get better in 4-6 weeks. Follow these instructions at home: If you have a neck collar:  Wear it as told by your doctor. Do not take off (do not remove) the collar unless your doctor says that this is safe.  Ask your doctor before adjusting your collar.  If you have long hair, keep it outside of the collar.  Ask your doctor if you may take off the collar for cleaning and bathing. If you may take off the collar:  Follow instructions from your doctor about how to take off the collar safely.  Clean the collar by wiping it with mild soap and water. Let it air-dry all the way.  If your collar has removable pads:  Take the pads out every 1-2 days.  Hand wash the pads with soap and water.  Let the pads air-dry all the way before you put them back in the collar. Do not dry them in a clothes dryer. Do not dry them with a hair dryer.  Check your skin under the collar for irritation or sores. If you see any, tell your doctor. Managing pain, stiffness, and swelling  Use a cervical traction device, if told by your doctor.  If told, put heat on the affected area. Do this before exercises (physical therapy) or as often as told by your doctor. Use the heat source that your doctor recommends, such as a moist heat pack or a heating pad.  Place a towel between your skin and the heat source.  Leave the heat on for 20-30 minutes.  Take the heat off (remove the heat) if your skin turns bright red. This is very important if you cannot feel pain, heat, or cold. You may have a greater risk of getting burned.  Put ice on the affected area.  Put ice in a plastic bag.  Place a towel between your skin and the bag.  Leave the ice on for 20 minutes, 2-3 times a day. Activity  Do not drive while wearing a neck collar. If you do not have a neck collar, ask your  doctor if it is safe to drive.  Do not drive or use heavy machinery while taking prescription pain medicine or muscle relaxants, unless your doctor approves.  Do not lift anything that is heavier than 10 lb (4.5 kg) until your doctor tells you that it is safe.  Rest as told by your doctor.  Avoid activities that make you feel worse. Ask your doctor what activities are safe for you.  Do exercises as told by your doctor or physical therapist. Preventing neck sprain  Practice good posture. Adjust your workstation to help with this, if needed.  Exercise regularly as told by your doctor or physical therapist.  Avoid activities that are risky or may cause a neck sprain (cervical sprain). General instructions  Take over-the-counter and prescription medicines only as told by your doctor.  Do not use any products that contain nicotine or tobacco. This includes cigarettes and e-cigarettes. If you need help quitting, ask your doctor.  Keep all follow-up visits as told by your doctor. This is important. Contact a doctor if:  You have pain or other symptoms that get worse.  You have symptoms that do not get better after 2 weeks.  You have pain that does not get better with medicine.  You start to have new,   unexplained symptoms.  You have sores or irritated skin from wearing your neck collar. Get help right away if:  You have very bad pain.  You have any of the following in any part of your body:  Loss of feeling (numbness).  Tingling.  Weakness.  You cannot move a part of your body (you have paralysis).  Your activity level does not improve. Summary  A cervical sprain is a stretch or tear in the tissues that connect bones (ligaments) in the neck.  If you have a neck (cervical) collar, do not take off the collar unless your doctor says that this is safe.  Put ice on affected areas as told by your doctor.  Put heat on affected areas as told by your doctor.  Good posture  and regular exercise can help prevent a neck sprain from happening again. This information is not intended to replace advice given to you by your health care provider. Make sure you discuss any questions you have with your health care provider. Document Released: 05/27/2008 Document Revised: 08/20/2016 Document Reviewed: 08/20/2016 Elsevier Interactive Patient Education  2017 Elsevier Inc.  
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
## 2018-05-25 NOTE — Assessment & Plan Note (Addendum)
Icing, elevation provided patient with  ankle brace to help with stability Ibuprofen and Tylenol as needed for pain Follow-up in 2 weeks if no improvement

## 2018-05-25 NOTE — Assessment & Plan Note (Signed)
Requesting chiropractor referral -placed per her desire Continue icing and heat depending on which helps Ibuprofen and Tylenol as needed

## 2018-05-26 ENCOUNTER — Telehealth: Payer: Self-pay | Admitting: *Deleted
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
## 2018-05-27 NOTE — Telephone Encounter (Signed)
Letter provided

## 2018-08-03 ENCOUNTER — Ambulatory Visit (INDEPENDENT_AMBULATORY_CARE_PROVIDER_SITE_OTHER): Payer: BC Managed Care – PPO | Admitting: Family Medicine

## 2018-08-03 DIAGNOSIS — N644 Mastodynia: Secondary | ICD-10-CM

## 2018-08-03 HISTORY — DX: Mastodynia: N64.4

## 2018-08-03 MED ORDER — DOXYCYCLINE HYCLATE 100 MG PO TABS: 100.0000 mg | ORAL_TABLET | Freq: Two times a day (BID) | ORAL | 0 refills | Status: AC

## 2018-08-03 NOTE — Progress Notes (Signed)
   Subjective:    Patient ID: Morgan Todd, female    DOB: [birth year], 32 y.o.   MRN: [id number]   CC: Pimple on left breast  HPI: Pimple on left breast Patient presenting today with left-sided breast tenderness and reports she had a pimple on her left breast.  Patient first noticed this 3 to 4 days ago.  States that is on the left side on her areola.  Patient notes that she attempted to pop the pimple and at that time noted white drainage coming out of her nipple and distinctly not out of the pimple.  Denies any erythema to area.  Says that her whole breast is tender.  Denies any family history or personal history of any breast cancers.  Patient does note family history of cervical and prostate cancer however denies any endometrial cancer.  Patient denies any medication changes.  Patient uses Nexplanon for birth control is sexually active.   Objective:  BP 115/70   Pulse 72   Temp 98.3 F (36.8 C) (Oral)   Wt 122 lb (55.3 kg)   SpO2 92%   BMI 23.83 kg/m  Vitals and nursing note reviewed  General: well nourished, in no acute distress HEENT: normocephalic, moist mucous membranes  Cardiac: RRR, clear S1 and S2, no murmurs, rubs, or gallops  Breasts: right breast normal without mass, skin or nipple changes or axillary nodes, left breast showing small tender growth on medial side of arepla.  Tenderness throughout left breast.  No masses palpated on left breast.  Left breast increased warmth compared to right. Skin: warm and dry Neuro: alert and oriented, no focal deficits   Assessment & Plan:    Breast tenderness Left breast tenderness likely secondary to infectious etiology.  Unclear whether this is cellulitis versus mastitis.  Given that patient had recent comedone it is likely that this developed a secondary infection.  We will plan to treat with doxycycline twice a day for 10 days.  Instructed patient to follow-up this time next week to see if there is improvement with  antibiotics.  At that time we will reevaluate for need of imaging.  Patient likely will not be able to tolerate imaging at this time due to severe tenderness.  Strict return precautions given.   Discussed patient with Dr. Leveda AnnaHensel  Return in about 1 week (around 08/10/2018).   [name], DO, PGY-2

## 2018-08-03 NOTE — Assessment & Plan Note (Signed)
Left breast tenderness likely secondary to infectious etiology.  Unclear whether this is cellulitis versus mastitis.  Given that patient had recent comedone it is likely that this developed a secondary infection.  We will plan to treat with doxycycline twice a day for 10 days.  Instructed patient to follow-up this time next week to see if there is improvement with antibiotics.  At that time we will reevaluate for need of imaging.  Patient likely will not be able to tolerate imaging at this time due to severe tenderness.  Strict return precautions given.

## 2018-08-03 NOTE — Patient Instructions (Signed)
It was a pleasure seeing you today.   Today we discussed your breast pain  For your breast infection: These use doxycycline twice a day for 10 days.  I want you to follow-up in 1 week.  At that time we will discuss whether or not you need imaging.  Please follow up in 1 week or sooner if symptoms persist or worsen. Please call the clinic immediately if you have any concerns.   Our clinic's number is [phone]. Please call with questions or concerns.    Thank you,  Oralia ManisSherin Emonee Winkowski, DO

## 2018-08-28 ENCOUNTER — Ambulatory Visit (INDEPENDENT_AMBULATORY_CARE_PROVIDER_SITE_OTHER): Payer: BC Managed Care – PPO | Admitting: Licensed Clinical Social Worker

## 2018-08-28 ENCOUNTER — Ambulatory Visit: Payer: BC Managed Care – PPO | Admitting: Family Medicine

## 2018-08-28 ENCOUNTER — Other Ambulatory Visit: Payer: Self-pay

## 2018-08-28 VITALS — BP 110/70 | HR 92 | Temp 98.2°F | Ht 60.0 in | Wt 120.0 lb

## 2018-08-28 DIAGNOSIS — F430 Acute stress reaction: Secondary | ICD-10-CM | POA: Diagnosis not present

## 2018-08-28 DIAGNOSIS — F411 Generalized anxiety disorder: Secondary | ICD-10-CM

## 2018-08-28 MED ORDER — HYDROXYZINE HCL 10 MG PO TABS: 10.0000 mg | ORAL_TABLET | Freq: Three times a day (TID) | ORAL | 1 refills | Status: DC | PRN

## 2018-08-28 NOTE — Assessment & Plan Note (Signed)
  Patient met with [facility] today. She has counseling services through her job. She ultimately wants to return to work but feels she needs some time to figure things out. She has appointment with counseling on 09/03/18. I will write her out of work through 9/13 and have her follow up on 9/13 to discuss if she needs longer term medical leave at that time. She has FMLA paperwork from job, will hold off on filling this out until we have a more definitive timeline. Rx for hydroxyzine 10 mg given to use TID as needed for anxiety. Patient verbalized understanding and agreement with plan.

## 2018-08-28 NOTE — Patient Instructions (Signed)
  Good to see you today. We'll see you back in 1 week to assess how things are going.  If you have questions or concerns please do not hesitate to call at [phone].  Dolores Patty, DO PGY-3,  Family Medicine 08/28/2018 10:21 AM
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
## 2018-09-04 ENCOUNTER — Ambulatory Visit: Payer: BC Managed Care – PPO | Admitting: Family Medicine

## 2018-12-14 ENCOUNTER — Ambulatory Visit: Payer: BC Managed Care – PPO | Admitting: Family Medicine

## 2018-12-17 ENCOUNTER — Encounter: Payer: Self-pay | Admitting: Family Medicine

## 2018-12-17 ENCOUNTER — Other Ambulatory Visit (HOSPITAL_COMMUNITY)
Admission: RE | Admit: 2018-12-17 | Discharge: 2018-12-17 | Disposition: A | Discharge status: Home / Self Care | Payer: BC Managed Care – PPO | Source: Ambulatory Visit | Attending: Family Medicine | Admitting: Family Medicine

## 2018-12-17 ENCOUNTER — Ambulatory Visit (INDEPENDENT_AMBULATORY_CARE_PROVIDER_SITE_OTHER): Payer: BC Managed Care – PPO | Admitting: Family Medicine

## 2018-12-17 ENCOUNTER — Other Ambulatory Visit: Payer: Self-pay

## 2018-12-17 VITALS — BP 106/78 | HR 62 | Temp 98.1°F | Ht 60.0 in | Wt 117.6 lb

## 2018-12-17 DIAGNOSIS — Z124 Encounter for screening for malignant neoplasm of cervix: Secondary | ICD-10-CM | POA: Insufficient documentation

## 2018-12-17 DIAGNOSIS — Z202 Contact with and (suspected) exposure to infections with a predominantly sexual mode of transmission: Secondary | ICD-10-CM | POA: Insufficient documentation

## 2018-12-17 LAB — POCT WET PREP (WET MOUNT)
Clue Cells Wet Prep Whiff POC: NEGATIVE
TRICHOMONAS WET PREP HPF POC: ABSENT
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
## 2018-12-17 NOTE — Patient Instructions (Signed)
I will call with test results.  Likely, I will have some results tomorrow.  I likely will not have all the results until I return on 12/28/18

## 2018-12-17 NOTE — Assessment & Plan Note (Signed)
Will screen with co-testing.  If pap again normal, OK to revert to q5y pap testing.

## 2018-12-17 NOTE — Assessment & Plan Note (Signed)
Will test for high risk HPV, GC, chlamydia, syphillus and HIV.  She is aware that screening is not recommended for low risk patient.

## 2018-12-18 LAB — RPR: RPR Ser Ql: NONREACTIVE

## 2018-12-18 LAB — CERVICOVAGINAL ANCILLARY ONLY
Chlamydia: NEGATIVE
NEISSERIA GONORRHEA: NEGATIVE

## 2018-12-18 LAB — HIV ANTIBODY (ROUTINE TESTING W REFLEX): HIV Screen 4th Generation wRfx: NONREACTIVE

## 2018-12-22 LAB — CYTOLOGY - PAP: DIAGNOSIS: NEGATIVE

## 2018-12-25 ENCOUNTER — Encounter: Payer: Self-pay | Admitting: *Deleted

## 2018-12-30 ENCOUNTER — Encounter: Payer: BC Managed Care – PPO | Admitting: Family Medicine

## 2019-01-06 ENCOUNTER — Encounter: Payer: BC Managed Care – PPO | Admitting: Family Medicine

## 2019-05-14 ENCOUNTER — Other Ambulatory Visit: Payer: Self-pay

## 2019-05-14 ENCOUNTER — Ambulatory Visit (INDEPENDENT_AMBULATORY_CARE_PROVIDER_SITE_OTHER): Payer: BC Managed Care – PPO | Admitting: Family Medicine

## 2019-05-14 ENCOUNTER — Encounter: Payer: Self-pay | Admitting: Family Medicine

## 2019-05-14 VITALS — BP 96/54 | HR 113 | Wt 118.6 lb

## 2019-05-14 DIAGNOSIS — F4320 Adjustment disorder, unspecified: Secondary | ICD-10-CM | POA: Insufficient documentation

## 2019-05-14 DIAGNOSIS — F4321 Adjustment disorder with depressed mood: Secondary | ICD-10-CM | POA: Insufficient documentation

## 2019-05-14 DIAGNOSIS — Z0000 Encounter for general adult medical examination without abnormal findings: Secondary | ICD-10-CM

## 2019-05-14 DIAGNOSIS — F419 Anxiety disorder, unspecified: Secondary | ICD-10-CM

## 2019-05-14 HISTORY — DX: Encounter for general adult medical examination without abnormal findings: Z00.00

## 2019-05-14 MED ORDER — SERTRALINE HCL 50 MG PO TABS: 50.0000 mg | ORAL_TABLET | Freq: Every day | ORAL | 1 refills | Status: DC

## 2019-05-14 MED ORDER — HYDROXYZINE HCL 10 MG PO TABS: 10.0000 mg | ORAL_TABLET | Freq: Three times a day (TID) | ORAL | 3 refills | Status: DC | PRN

## 2019-05-14 NOTE — Assessment & Plan Note (Signed)
Patient is up-to-date on health maintenance and understands that she will need to get her Nexplanon removed in about 1 month and a new one reinserted at that time since she wants to continue with this birth control method.  She does not need any labs today due to her young age and lack of comorbidities.  Patient was congratulated on her continued tobacco cessation.

## 2019-05-14 NOTE — Patient Instructions (Addendum)
It was nice seeing you today Morgan Todd!  Today, we refilled your hydroxyzine and started the medication Zoloft.  Please take this once per day in the morning.  It will reach its full effect in about 6 to 8 weeks.  Please make an appointment in about 1 or 2 months to replace her Nexplanon and to discuss how this medication is working for you.  If you have any issues with this medication, please do not hesitate to call.  If you have any questions or concerns, please feel free to call the clinic.   Be well,  Dr. Frances Furbish

## 2019-05-14 NOTE — Assessment & Plan Note (Signed)
Patient's mild tachycardia today could be due to her anxiety, which patient says is causing her distress.  Will refill patient's hydroxyzine and start Zoloft 50 mg daily.  Patient was counseled that this medication takes up to 6 to 8 weeks to have its full effect.  Would like to see patient to discuss how this medication is working for her at around this time, which will be when she needs to have her Nexplanon removed.  Told patient that she should call us if she has any difficulties with this medication, and that this medication can be increased over time if she needs.
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
## 2019-06-06 ENCOUNTER — Other Ambulatory Visit: Payer: Self-pay | Admitting: Family Medicine

## 2019-06-29 ENCOUNTER — Ambulatory Visit: Payer: BC Managed Care – PPO | Admitting: Family Medicine

## 2019-07-16 ENCOUNTER — Other Ambulatory Visit: Payer: Self-pay

## 2019-07-16 ENCOUNTER — Ambulatory Visit: Payer: BC Managed Care – PPO | Admitting: Family Medicine

## 2019-07-16 ENCOUNTER — Encounter: Payer: Self-pay | Admitting: Family Medicine

## 2019-07-16 ENCOUNTER — Ambulatory Visit (INDEPENDENT_AMBULATORY_CARE_PROVIDER_SITE_OTHER): Payer: BC Managed Care – PPO | Admitting: Family Medicine

## 2019-07-16 VITALS — BP 94/68 | HR 83 | Temp 98.4°F | Wt 116.4 lb

## 2019-07-16 DIAGNOSIS — F419 Anxiety disorder, unspecified: Secondary | ICD-10-CM | POA: Diagnosis not present

## 2019-07-16 DIAGNOSIS — Z3046 Encounter for surveillance of implantable subdermal contraceptive: Secondary | ICD-10-CM

## 2019-07-16 MED ORDER — ETONOGESTREL 68 MG ~~LOC~~ IMPL
68.0000 mg | DRUG_IMPLANT | Freq: Once | SUBCUTANEOUS | Status: AC
  Administered 2019-07-16: 17:00:00 68 mg via SUBCUTANEOUS

## 2019-07-16 MED ORDER — SERTRALINE HCL 100 MG PO TABS: 50.0000 mg | ORAL_TABLET | Freq: Every day | ORAL | 3 refills | Status: DC

## 2019-07-16 NOTE — Patient Instructions (Signed)
It was nice seeing you today Ms. Byrum!  We have changed her Zoloft from 50 mg to 100 mg daily.  It is still 1 pill/day.  I have sent that to your pharmacy this morning.  Please let me know if this new dose is not working for you and we can always change back to 50 mg/day.  Please let me know if you are having any problems with your new Nexplanon or have any signs of infection, such as redness, swelling, or pain.  If you have any questions or concerns, please feel free to call the clinic.   Be well,  Dr. Shan Levans

## 2019-07-16 NOTE — Assessment & Plan Note (Signed)
Currently improved with Zoloft 50 mg/day.  We will increase the dose per patient request to Zoloft 100 mg/day.  Patient was counseled that we can always decrease the dose back to 50 mg/day if she has any adverse effects from the increased dosage.

## 2019-07-16 NOTE — Addendum Note (Signed)
Addended by: Christen Bame D on: 07/16/2019 04:48 PM   Modules accepted: Orders
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
## 2019-09-16 ENCOUNTER — Other Ambulatory Visit: Payer: Self-pay | Admitting: Family Medicine

## 2019-10-27 ENCOUNTER — Other Ambulatory Visit: Payer: Self-pay

## 2019-10-27 DIAGNOSIS — Z20822 Contact with and (suspected) exposure to covid-19: Secondary | ICD-10-CM

## 2019-10-28 LAB — NOVEL CORONAVIRUS, NAA: SARS-CoV-2, NAA: NOT DETECTED

## 2020-01-10 ENCOUNTER — Telehealth: Payer: Self-pay | Admitting: *Deleted

## 2020-01-10 NOTE — Telephone Encounter (Signed)
LVM for pt to call office back to inform her that she has refills on her sertraline.  Received fax for refills and contacted pharmacy to be sure there were still refills and they said yes and was not sure why we got a fax for this.  They are going to go ahead and fill this.  If pt calls back please inform her of this.Morgan Todd, CMA

## 2020-03-23 ENCOUNTER — Ambulatory Visit: Payer: BC Managed Care – PPO | Attending: Family

## 2020-03-23 DIAGNOSIS — Z23 Encounter for immunization: Secondary | ICD-10-CM

## 2020-03-23 NOTE — Progress Notes (Signed)
   Covid-19 Vaccination Clinic  Name:  PUJA CAFFEY    MRN: [id number] DOB: 09-24-1986  03/23/2020  Ms. Rufo was observed post Covid-19 immunization for 15 minutes without incident. She was provided with Vaccine Information Sheet and instruction to access the V-Safe system.   Ms. Salonga was instructed to call 911 with any severe reactions post vaccine: Marland Kitchen Difficulty breathing  . Swelling of face and throat  . A fast heartbeat  . A bad rash all over body  . Dizziness and weakness   Immunizations Administered    Name Date Dose VIS Date Route   Moderna COVID-19 Vaccine 03/23/2020  2:17 PM 0.5 mL 11/23/2019 Intramuscular   Manufacturer: Moderna   Lot: [id number]   NDC: 95638-756-43

## 2020-04-25 ENCOUNTER — Ambulatory Visit: Payer: BC Managed Care – PPO | Attending: Family

## 2020-04-25 DIAGNOSIS — Z23 Encounter for immunization: Secondary | ICD-10-CM

## 2020-04-25 NOTE — Progress Notes (Signed)
   Covid-19 Vaccination Clinic  Name:  Morgan Todd    MRN: [id number] DOB: 1985/12/26  04/25/2020  Morgan Todd was observed post Covid-19 immunization for 15 minutes without incident. She was provided with Vaccine Information Sheet and instruction to access the V-Safe system.   Morgan Todd was instructed to call 911 with any severe reactions post vaccine: Marland Kitchen Difficulty breathing  . Swelling of face and throat  . A fast heartbeat  . A bad rash all over body  . Dizziness and weakness   Immunizations Administered    Name Date Dose VIS Date Route   Moderna COVID-19 Vaccine 04/25/2020  1:55 PM 0.5 mL 11/2019 Intramuscular   Manufacturer: Moderna   Lot: [id number]   NDC: 61483-073-54

## 2020-04-28 ENCOUNTER — Ambulatory Visit: Payer: BC Managed Care – PPO | Attending: Internal Medicine

## 2020-04-28 DIAGNOSIS — Z20822 Contact with and (suspected) exposure to covid-19: Secondary | ICD-10-CM

## 2020-04-29 LAB — SARS-COV-2, NAA 2 DAY TAT

## 2020-04-29 LAB — NOVEL CORONAVIRUS, NAA: SARS-CoV-2, NAA: NOT DETECTED

## 2020-06-22 ENCOUNTER — Other Ambulatory Visit: Payer: Self-pay | Admitting: Family Medicine

## 2020-08-30 ENCOUNTER — Other Ambulatory Visit: Payer: Self-pay | Admitting: *Deleted

## 2020-08-30 MED ORDER — SERTRALINE HCL 100 MG PO TABS: 50.0000 mg | ORAL_TABLET | Freq: Every day | ORAL | 3 refills | Status: DC

## 2020-08-30 NOTE — Telephone Encounter (Signed)
Patient needs appt for annual visit.

## 2020-08-31 NOTE — Telephone Encounter (Signed)
LVM to call office back so we can get her an appointment scheduled for an annual check up per Dr. Selena Batten, will also send a MyChart Message requesting her to call and schedule this.Zameer Borman Zimmerman Rumple, CMA

## 2020-09-08 ENCOUNTER — Other Ambulatory Visit: Payer: Self-pay | Admitting: Family Medicine

## 2020-10-25 ENCOUNTER — Ambulatory Visit: Payer: BC Managed Care – PPO | Attending: Family

## 2020-10-25 DIAGNOSIS — Z23 Encounter for immunization: Secondary | ICD-10-CM

## 2020-10-30 ENCOUNTER — Other Ambulatory Visit: Payer: Self-pay

## 2020-10-30 ENCOUNTER — Ambulatory Visit (INDEPENDENT_AMBULATORY_CARE_PROVIDER_SITE_OTHER): Payer: BC Managed Care – PPO | Admitting: Family Medicine

## 2020-10-30 VITALS — BP 104/70 | HR 73 | Ht 60.0 in | Wt 130.5 lb

## 2020-10-30 DIAGNOSIS — M79605 Pain in left leg: Secondary | ICD-10-CM | POA: Insufficient documentation

## 2020-10-30 HISTORY — DX: Pain in left leg: M79.605

## 2020-10-30 NOTE — Patient Instructions (Addendum)
It was wonderful to see you today.  Today we talked about:  Left lower leg pain. We discussed trying ibuprofen as needed for pain relief. Range of motion exercises may help as well. Inversion (moving your foot inward as I demonstrated I the room will help stretch those muscles. This will likely resolve on its own.   Please call the clinic at [phone] if your symptoms worsen or you have any concerns. It was our pleasure to serve you.  Dr. Salvadore Dom  Peroneal Tendinopathy  Peroneal tendinopathy is irritation of the tendons that pass behind your ankle (peroneal tendons). These tendons attach muscles in your foot to a bone on the side of your foot and underneath the arch of your foot. This condition can cause your peroneal tendons to get bigger and swell. What are the causes? This condition may be caused by:  Putting stress on your ankle over and over again (overuse injury).  A sudden injury that puts stress on your tendons, such as an ankle sprain. What increases the risk? You are more likely to develop this condition if you:  Have high arches.  Play sports that involve putting stress on the ankle over and over again. These sports include: ? Running. ? Dancing. ? Soccer. ? Basketball. What are the signs or symptoms? Symptoms of this condition can start suddenly or develop gradually. Symptoms of this condition include:  Pain in the back of the ankle, on the side of the foot, or in the arch of the foot.  Pain that gets worse with activity and better with rest.  Swelling.  Warmth.  Weakness in your foot or ankle. How is this diagnosed? This condition may be diagnosed based on:  Your symptoms.  Your medical history.  A physical exam. During the exam, your health care provider may move your foot and ankle and test the strength of your leg muscles.  Imaging tests, such as: ? X-rays or a CT scan to check for bone injury. ? MRI or ultrasound to check for muscle or  tendon injury. How is this treated? This condition may be treated by:  Keeping your body weight off your ankle for several days.  Returning to full activity gradually.  Putting ice on your ankle to reduce swelling.  Taking NSAIDs, such as ibuprofen.  Having medicine injected into your tendon to reduce swelling.  Wearing a removable boot or brace for ankle support.  Doing range-of-motion exercises and strengthening exercises (physical therapy) when pain and swelling improve. If the condition does not improve with treatment, or if a tendon or muscle is damaged, surgery may be needed. Follow these instructions at home: If you have a boot or brace:  Wear the boot or brace as told by your health care provider. Remove it only as told by your health care provider.  Loosen the boot or brace if your toes tingle, become numb, or turn cold and blue.  Keep the boot or brace clean.  If the boot or brace is not waterproof: ? Do not let it get wet. ? Cover it with a watertight covering when you take a bath or shower. Managing pain, stiffness, and swelling   If directed, put ice on the injured area. ? If you have a removable boot or brace, remove it as told by your health care provider. ? Put ice in a plastic bag. ? Place a towel between your skin and the bag. ? Leave the ice on for 20 minutes, 2-3 times a day.  Move your toes often to reduce stiffness and swelling.  Raise (elevate) your ankle above the level of your heart while you are sitting or lying down. Activity  Do not do activities that make pain or swelling worse.  Do exercises as told by your health care provider.  Return to your normal activities as told by your health care provider. Ask your health care provider what activities are safe for you.  Ask your health care provider when it is safe to drive if you have a boot or brace on your foot. General instructions  Take over-the-counter and prescription medicines only  as told by your health care provider.  Do not use any products that contain nicotine or tobacco, such as cigarettes, e-cigarettes, and chewing tobacco. These can delay healing. If you need help quitting, ask your health care provider.  Keep all follow-up visits as told by your health care provider. This is important. How is this prevented?  Wear supportive footwear that is appropriate for your athletic activity.  Avoid athletic activities that cause swelling or pain in your ankle or foot.  See your health care provider if you have pain or swelling that does not improve after a few days of rest.  Stop training if you develop pain or swelling.  If you start a new athletic activity, start gradually to build up your strength, endurance, and flexibility.  Warm up and stretch before being active.  Cool down and stretch after being active. Contact a health care provider if:  Your symptoms get worse.  Your symptoms do not improve in 2-4 weeks.  You develop new, unexplained symptoms. Summary  Peroneal tendinopathy is irritation of the tendons that pass behind your ankle.  This condition is caused by overuse or sudden injury to the peroneal tendon.  Symptoms include pain, swelling, warmth, and weakness in your foot or ankle.  This condition is treated with rest, ice, medicines, physical therapy, and surgery if needed. This information is not intended to replace advice given to you by your health care provider. Make sure you discuss any questions you have with your health care provider. Document Revised: 04/01/2019 Document Reviewed: 01/18/2019 Elsevier Patient Education  2020 [employer].

## 2020-10-30 NOTE — Assessment & Plan Note (Signed)
Acute x3 days.  No history of trauma to the site.  Discomfort with lateral palpation as well as eversion is suggestive of peroneal tendinopathy.  Will likely resolve on its own.  Suggested anti-inflammatories such as ibuprofen as needed for discomfort.  Also digestant exercises to stretch tendon such as inversion.  Patient can follow-up in 1 to 2 weeks if discomfort worsens or does not improve.

## 2020-10-30 NOTE — Progress Notes (Signed)
    SUBJECTIVE:   CHIEF COMPLAINT / HPI:   Ms. Morgan Todd is a 34 year old female who presents to express care for the issue below.  Left ankle pain Last ankle and shin pain started Saturday night.  Felt like spasms.  Yesterday could not balance on the same foot in caught herself almost falling out of bed.  Has not tried anything for it.  Has a history of shin splints.  Denies any trauma to the area prior to the event.  PERTINENT  PMH / PSH: History of right ankle sprain 2 years prior, history of hand spasms  OBJECTIVE:   BP 104/70   Pulse 73   Ht 5' (1.524 m)   Wt 130 lb 8 oz (59.2 kg)   SpO2 97%   BMI 25.49 kg/m   General: Appears well, no acute distress. Age appropriate. Respiratory: normal effort MSK/Extremities: Left foot and ankle without any obvious bruising.  Posterior tibial pulses intact.  Full range of motion ankle joint.  Lateral leg discomfort with eversion and relief with inversion and palpation.  ASSESSMENT/PLAN:   Acute pain of left lower extremity Acute x3 days.  No history of trauma to the site.  Discomfort with lateral palpation as well as eversion is suggestive of peroneal tendinopathy.  Will likely resolve on its own.  Suggested anti-inflammatories such as ibuprofen as needed for discomfort.  Also digestant exercises to stretch tendon such as inversion.  Patient can follow-up in 1 to 2 weeks if discomfort worsens or does not improve.   Morgan Jumbo, DO [facility]

## 2021-01-07 NOTE — Progress Notes (Signed)
   Covid-19 Vaccination Clinic  Name:  Morgan Todd    MRN: [id number] DOB: [birth year]  01/07/2021  [name] was observed post Covid-19 immunization for 15 minutes without incident. She was provided with Vaccine Information Sheet and instruction to access the V-Safe system.   [name] was instructed to call 911 with any severe reactions post vaccine: Marland Kitchen Difficulty breathing  . Swelling of face and throat  . A fast heartbeat  . A bad rash all over body  . Dizziness and weakness   Immunizations Administered    Name Date Dose VIS Date Route   Moderna COVID-19 Vaccine 10/25/2020 -- -- --   Lot: [id number]   Moderna Covid-19 Booster Vaccine 10/25/2020  8:45 AM 0.25 mL 10/11/2020 Intramuscular   Manufacturer: Moderna   Lot: [id number]   NDC: 18984-210-31

## 2021-04-14 ENCOUNTER — Encounter (HOSPITAL_COMMUNITY): Payer: Self-pay

## 2021-04-14 ENCOUNTER — Other Ambulatory Visit: Payer: Self-pay

## 2021-04-14 ENCOUNTER — Emergency Department (HOSPITAL_COMMUNITY): Payer: Medicaid Other

## 2021-04-14 ENCOUNTER — Emergency Department (HOSPITAL_COMMUNITY)
Admission: EM | Admit: 2021-04-14 | Discharge: 2021-04-14 | Disposition: A | Discharge status: Home / Self Care | Payer: Medicaid Other | Attending: Emergency Medicine | Admitting: Emergency Medicine

## 2021-04-14 DIAGNOSIS — Z7951 Long term (current) use of inhaled steroids: Secondary | ICD-10-CM | POA: Insufficient documentation

## 2021-04-14 DIAGNOSIS — S63641A Sprain of metacarpophalangeal joint of right thumb, initial encounter: Secondary | ICD-10-CM | POA: Insufficient documentation

## 2021-04-14 DIAGNOSIS — J45901 Unspecified asthma with (acute) exacerbation: Secondary | ICD-10-CM | POA: Insufficient documentation

## 2021-04-14 DIAGNOSIS — Z87891 Personal history of nicotine dependence: Secondary | ICD-10-CM | POA: Insufficient documentation

## 2021-04-14 DIAGNOSIS — W19XXXA Unspecified fall, initial encounter: Secondary | ICD-10-CM | POA: Insufficient documentation

## 2021-04-14 NOTE — ED Triage Notes (Signed)
Pt fell on her arm. Right thumb is swollen and with limited mobility. Good color, palpable radial pulse.

## 2021-04-14 NOTE — Discharge Instructions (Addendum)
You were seen in the emergency department for thumb pain after a fall  X-ray did not show any abnormalities in the joints like fractures, dislocations  Your pain is most likely from a soft tissue injury like a sprain of the tendons  Use your thumb spica for the next 48 to 72 hours for comfort, immobilization, compression.  After 72 hours start doing range of motion exercises of your thumb as tolerated.  Alternate ibuprofen and acetaminophen for pain every 6-8 hours or as needed.  Ice.  Elevate.  Follow-up with primary care doctor in 7 to 10 days if pain and range of motion have not improved
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
## 2021-04-14 NOTE — ED Notes (Signed)
Paged ortho 

## 2021-04-14 NOTE — Progress Notes (Signed)
Orthopedic Tech Progress Note Patient Details:  JERIANN SAYRES 04-08-1986 [id number]  Ortho Devices Type of Ortho Device: Thumb velcro splint Ortho Device/Splint Location: Right Upper Extremity Ortho Device/Splint Interventions: Ordered,Application,Adjustment   Post Interventions Patient Tolerated: Well Instructions Provided: Adjustment of device,Care of device,Poper ambulation with device   Gerald Stabs 04/14/2021, 12:09 PM

## 2021-04-30 ENCOUNTER — Encounter: Payer: Self-pay | Admitting: Family Medicine

## 2021-04-30 ENCOUNTER — Other Ambulatory Visit: Payer: Self-pay

## 2021-04-30 ENCOUNTER — Ambulatory Visit (INDEPENDENT_AMBULATORY_CARE_PROVIDER_SITE_OTHER): Payer: BC Managed Care – PPO | Admitting: Family Medicine

## 2021-04-30 DIAGNOSIS — S63659S Sprain of metacarpophalangeal joint of unspecified finger, sequela: Secondary | ICD-10-CM | POA: Insufficient documentation

## 2021-04-30 DIAGNOSIS — Z008 Encounter for other general examination: Secondary | ICD-10-CM | POA: Diagnosis not present

## 2021-04-30 HISTORY — DX: Sprain of metacarpophalangeal joint of unspecified finger, sequela: S63.659S

## 2021-04-30 NOTE — Progress Notes (Signed)
    SUBJECTIVE:   CHIEF COMPLAINT / HPI:   Thumb pain Patient is following up today after being seen in the emergency department on 04/14/2021 for metacarpal sprain of her right hand.  Patient is right-hand dominant.  She reports that after the emergency room, she continue to wear the spica cast for 72 hours and then discontinued use.  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  She reports that the pain has returned and has started moving more proximally.  She is got aching pain in the MCP area.  She denies any changes in sensation, erythema.  She does note some mild joint swelling.  PERTINENT  PMH / PSH: No previous inury of surgery to right hand   OBJECTIVE:   BP 102/72   Pulse 80   Ht 5' (1.524 m)   Wt 132 lb 6.4 oz (60.1 kg)   SpO2 99%   BMI 25.86 kg/m   General: well appearing, mild distress from physical exam  Hands: No obvious swelling, erythema.  No warmth or swelling on palpation.  Patient does have tenderness to palpation directly over anterior and radial aspect of right first MCP.  She does not have any snuffbox tenderness.  She has full range of motion of her wrist as well as her thumb.  She has pain with resistance abduction and abduction, extension and flexion of her thumb.   ASSESSMENT/PLAN:   Sprain of MCP joint of hand, sequela Reviewed radiographs which show no fracture of MCP.  This is likely exacerbation of the sprain as patient continue to work and discontinue spica cast. I am not concerned for any scaphoid injury as she has no tenderness to palpation of her wrist, snuffbox.  She has full range of motion.   I have recommended that she use a spica cast for immobilization for at least a week and refrain from overuse.  She can use over-the-counter NSAIDs, Tylenol and heat and ice for pain.  If patient does not have any improvement or has worsening, she should return to care.     Melene Plan,  MD [facility]

## 2021-04-30 NOTE — Assessment & Plan Note (Signed)
Reviewed radiographs which show no fracture of MCP.  This is likely exacerbation of the sprain as patient continue to work and discontinue spica cast. I am not concerned for any scaphoid injury as she has no tenderness to palpation of her wrist, snuffbox.  She has full range of motion.   I have recommended that she use a spica cast for immobilization for at least a week and refrain from overuse.  She can use over-the-counter NSAIDs, Tylenol and heat and ice for pain.  If patient does not have any improvement or has worsening, she should return to care.

## 2021-05-01 ENCOUNTER — Telehealth: Payer: Self-pay

## 2021-05-01 NOTE — Telephone Encounter (Signed)
If we have any Spica casts (right, size S/M), she can pick on up from the office.  If we do not have, she can get it from a medical supply store, pharmacy or even order on [place].

## 2021-05-01 NOTE — Telephone Encounter (Signed)
Called patient and informed of options as we do not have cast in office.   Patient verbalizes understanding.   Veronda Prude, RN

## 2021-05-01 NOTE — Telephone Encounter (Signed)
Patient calls nurse line regarding being unable to find Spica cast. Per OV note yesterday, patient is to wear Spica cast for at least one week.   Patient is requesting new cast. Please advise.   Veronda Prude, RN

## 2021-08-23 ENCOUNTER — Encounter: Payer: Self-pay | Admitting: Family Medicine

## 2021-08-23 NOTE — Patient Instructions (Signed)
It was wonderful to see you today. Thank you for allowing me to be a part of your care. Below is a short summary of what we discussed at your visit today:  Nexplanon - We will take out your nexplanon at the next appointment and prescribe pills  Health Maintenance We like to think about ways to keep you healthy for years to come. Below are some interventions and screenings we can offer to keep you healthy: - Hepatitis C screening (we recommend this for every adult at least once in their life time) - Flu vaccine (available here in clinic after Labor Day)   Please bring all of your medications to every appointment!  If you have any questions or concerns, please do not hesitate to contact us via phone or MyChart message.   Fayette Pho, MD

## 2021-08-23 NOTE — Progress Notes (Signed)
    SUBJECTIVE:   CHIEF COMPLAINT / HPI:   Contraception management - Patient presents today to discussed removal of Nexplanon - She would like to remove her Nexplanon soon and then go on OCPs until she and her husband are ready to start trying for children - Upon discussion of contraception options, she would like to do an OCP - last used OCP called "Yaz" but doesn't want to use again because of recall commercials on TV - Patient has no personal history of clots or migraines - PMH of sickle cell trait - Former smoker, quit in 2018 and does not smoke currently - Patient's mother had sickle cell disease and blood clots, but no known hereditary hypercoagulable states  PERTINENT  PMH / PSH: Sickle cell trait, asthma  OBJECTIVE:   BP 110/60   Pulse 80   Ht 5' (1.524 m)   Wt 123 lb (55.8 kg)   BMI 24.02 kg/m   PHQ-9:  Depression screen [facility] 2/9 08/24/2021 04/30/2021 10/30/2020  Decreased Interest 0 1 0  Down, Depressed, Hopeless 0 1 0  PHQ - 2 Score 0 2 0  Altered sleeping 0 1 1  Tired, decreased energy 0 1 1  Change in appetite 0 1 1  Feeling bad or failure about yourself  0 0 0  Trouble concentrating 0 1 0  Moving slowly or fidgety/restless 0 0 0  Suicidal thoughts 0 0 0  PHQ-9 Score 0 6 3  Difficult doing work/chores Not difficult at all Not difficult at all Not difficult at all  Some recent data might be hidden     GAD-7: No flowsheet data found.   Physical Exam General: Awake, alert, oriented, no acute distress Respiratory: Unlabored respirations, speaking in full sentences, no respiratory distress Extremities: Moving all extremities spontaneously Neuro: Cranial nerves II through X grossly intact Psych: Normal insight and judgement  ASSESSMENT/PLAN:   Contraception management Patient requests Nexplanon removal.  She and her husband want to start trying for a baby in the near future.  Discussed options today.  Patient would like to remove Nexplanon and go on OCP until  she is ready.  I have scheduled her on 9/14 for access to care clinic at 1:50 PM, secured two time slots for this procedure.  I have also pended her OCP prescription to this visit, the provider at that time may sign and send her pharmacy.   Fayette Pho, MD [facility]

## 2021-08-24 ENCOUNTER — Ambulatory Visit (INDEPENDENT_AMBULATORY_CARE_PROVIDER_SITE_OTHER): Payer: BC Managed Care – PPO | Admitting: Family Medicine

## 2021-08-24 ENCOUNTER — Other Ambulatory Visit: Payer: Self-pay

## 2021-08-24 ENCOUNTER — Encounter: Payer: Self-pay | Admitting: Family Medicine

## 2021-08-24 VITALS — BP 110/60 | HR 80 | Ht 60.0 in | Wt 123.0 lb

## 2021-08-24 DIAGNOSIS — Z113 Encounter for screening for infections with a predominantly sexual mode of transmission: Secondary | ICD-10-CM | POA: Diagnosis not present

## 2021-08-24 DIAGNOSIS — Z1159 Encounter for screening for other viral diseases: Secondary | ICD-10-CM | POA: Diagnosis not present

## 2021-08-24 DIAGNOSIS — Z114 Encounter for screening for human immunodeficiency virus [HIV]: Secondary | ICD-10-CM

## 2021-08-24 DIAGNOSIS — N949 Unspecified condition associated with female genital organs and menstrual cycle: Secondary | ICD-10-CM | POA: Diagnosis not present

## 2021-08-24 DIAGNOSIS — Z3009 Encounter for other general counseling and advice on contraception: Secondary | ICD-10-CM

## 2021-08-26 NOTE — Assessment & Plan Note (Signed)
Patient requests Nexplanon removal.  She and her husband want to start trying for a baby in the near future.  Discussed options today.  Patient would like to remove Nexplanon and go on OCP until she is ready.  I have scheduled her on 9/14 for access to care clinic at 1:50 PM, secured two time slots for this procedure.  I have also pended her OCP prescription to this visit, the provider at that time may sign and send her pharmacy.

## 2021-09-05 ENCOUNTER — Encounter: Payer: Self-pay | Admitting: Family Medicine

## 2021-09-05 ENCOUNTER — Ambulatory Visit (INDEPENDENT_AMBULATORY_CARE_PROVIDER_SITE_OTHER): Payer: BC Managed Care – PPO | Admitting: Family Medicine

## 2021-09-05 ENCOUNTER — Other Ambulatory Visit: Payer: Self-pay

## 2021-09-05 VITALS — BP 110/71 | HR 64 | Wt 123.0 lb

## 2021-09-05 DIAGNOSIS — Z3046 Encounter for surveillance of implantable subdermal contraceptive: Secondary | ICD-10-CM | POA: Diagnosis not present

## 2021-09-05 DIAGNOSIS — Z30011 Encounter for initial prescription of contraceptive pills: Secondary | ICD-10-CM | POA: Diagnosis not present

## 2021-09-05 MED ORDER — NORGESTIMATE-ETH ESTRADIOL 0.25-35 MG-MCG PO TABS: 1.0000 | ORAL_TABLET | Freq: Every day | ORAL | 11 refills | Status: DC

## 2021-09-05 NOTE — Progress Notes (Deleted)
   SUBJECTIVE:   CHIEF COMPLAINT / HPI:   No chief complaint on file.    Morgan Todd is a 35 y.o. female here for ***   Pt reports ***    PERTINENT  PMH / PSH: reviewed and updated as appropriate   OBJECTIVE:   There were no vitals taken for this visit.  ***  ASSESSMENT/PLAN:   No problem-specific Assessment & Plan notes found for this encounter.     Katha Cabal, DO PGY-3, [facility] 09/05/2021      {    This will disappear when note is signed, click to select method of visit    :1}

## 2021-09-05 NOTE — Patient Instructions (Addendum)
Nexplanon Instructions After Removal   Keep bandage clean and dry for 24 hours  May use ice/Tylenol/Ibuprofen for soreness or pain  If you develop fever, drainage or increased warmth from incision site-contact office immediately  Contact a health care provider if: You have redness, swelling, or pain around your excision site. You have fluid or blood coming from your excision site. Your excision site feels warm to the touch. You have pus or a bad smell coming from your excision site. You have a fever. You have pain that does not improve in 2-3 days after your procedure. You notice skin irregularities or changes in how you feel (sensation).

## 2021-09-05 NOTE — Progress Notes (Signed)
   SUBJECTIVE:   CHIEF COMPLAINT / HPI:   Chief Complaint  Patient presents with   Contraception    Removal      Morgan Todd is a 35 y.o. female here for nexplanon removal. Pt would like to have another child with her new partner. She is wanting a daughter as she has two sons.  She is interested in starting birth control pills. No concerns today.   PERTINENT  PMH / PSH: reviewed and updated as appropriate   OBJECTIVE:   BP 110/71   Pulse 64   Wt 123 lb (55.8 kg)   SpO2 100%   BMI 24.02 kg/m   GEN: well appearing female in no acute distress  CVS: well perfused  RESP: speaking in full sentences without pause, no respiratory distress  SKIN: warm dry, 4 cm linear object (c/w Nexplanon) palpable on medial left upper arm  PROCEDURE NOTE: NEXPLANON  REMOVAL Patient given informed consent and signed copy in the chart. Left arm area prepped and draped in the usual sterile fashion. Two cc of lidocaine without epinephrine 1% used for local anesthesia. A small stab incision was made close to the nexplanon with scalpel. Hemostats were used to withdraw the nexplanon. A small bandage was applied over a steri strip  Bleeding controlled with gentle pressure.   ASSESSMENT/PLAN:     Nexplanon Removal  Nexplanon removed at pt request. Proceed tolerated well. Patient given follow up instructions should she experience redness, swelling at sight or fever in the next 24 hours. OTC analgesics PRN for pain. Patient was reminded this totally removes her nexplanon contraceptive devise. She can now potentially conceive if she chooses not to take OCPs.   Education given regarding options for contraception, including barrier methods, injectable contraception, IUD placement, oral contraceptives. Patient choose OCPs as she is unsure when she wants to start trying to conceive. Advised to take prenatal vitamins. The patient denies history of VTE. They have no history of hypertension or migraine with aura. Has  no other contraindications for estrogen therapy.  - Start Sprintec      Katha Cabal, DO PGY-3, [facility] 09/05/2021

## 2021-09-12 ENCOUNTER — Emergency Department (HOSPITAL_COMMUNITY): Payer: BC Managed Care – PPO

## 2021-09-12 ENCOUNTER — Other Ambulatory Visit: Payer: Self-pay

## 2021-09-12 ENCOUNTER — Emergency Department (HOSPITAL_COMMUNITY)
Admission: EM | Admit: 2021-09-12 | Discharge: 2021-09-12 | Disposition: A | Discharge status: Home / Self Care | Payer: BC Managed Care – PPO | Attending: Emergency Medicine | Admitting: Emergency Medicine

## 2021-09-12 DIAGNOSIS — Z87891 Personal history of nicotine dependence: Secondary | ICD-10-CM | POA: Insufficient documentation

## 2021-09-12 DIAGNOSIS — J45909 Unspecified asthma, uncomplicated: Secondary | ICD-10-CM | POA: Diagnosis not present

## 2021-09-12 DIAGNOSIS — J069 Acute upper respiratory infection, unspecified: Secondary | ICD-10-CM | POA: Diagnosis not present

## 2021-09-12 DIAGNOSIS — Z20822 Contact with and (suspected) exposure to covid-19: Secondary | ICD-10-CM | POA: Insufficient documentation

## 2021-09-12 DIAGNOSIS — R059 Cough, unspecified: Secondary | ICD-10-CM | POA: Diagnosis present

## 2021-09-12 DIAGNOSIS — R42 Dizziness and giddiness: Secondary | ICD-10-CM | POA: Insufficient documentation

## 2021-09-12 DIAGNOSIS — E876 Hypokalemia: Secondary | ICD-10-CM | POA: Diagnosis not present

## 2021-09-12 LAB — CBC
HCT: 39 % (ref 36.0–46.0)
Hemoglobin: 13.5 g/dL (ref 12.0–15.0)
MCH: 30.6 pg (ref 26.0–34.0)
MCHC: 34.6 g/dL (ref 30.0–36.0)
MCV: 88.4 fL (ref 80.0–100.0)
Platelets: 189 10*3/uL (ref 150–400)
RBC: 4.41 MIL/uL (ref 3.87–5.11)
RDW: 12.1 % (ref 11.5–15.5)
WBC: 2.5 10*3/uL — ABNORMAL LOW (ref 4.0–10.5)
nRBC: 0 % (ref 0.0–0.2)

## 2021-09-12 LAB — URINALYSIS, ROUTINE W REFLEX MICROSCOPIC
Bilirubin Urine: NEGATIVE
Glucose, UA: NEGATIVE mg/dL
Ketones, ur: NEGATIVE mg/dL
Leukocytes,Ua: NEGATIVE
Nitrite: NEGATIVE
Protein, ur: NEGATIVE mg/dL
Specific Gravity, Urine: <1.005 — ABNORMAL LOW (ref 1.005–1.030)
pH: 6 (ref 5.0–8.0)

## 2021-09-12 LAB — COMPREHENSIVE METABOLIC PANEL
ALT: 18 U/L (ref 0–44)
AST: 28 U/L (ref 15–41)
Albumin: 3.8 g/dL (ref 3.5–5.0)
Alkaline Phosphatase: 41 U/L (ref 38–126)
Anion gap: 10 (ref 5–15)
BUN: <5 mg/dL — ABNORMAL LOW (ref 6–20)
CO2: 27 mmol/L (ref 22–32)
Calcium: 8.7 mg/dL — ABNORMAL LOW (ref 8.9–10.3)
Chloride: 99 mmol/L (ref 98–111)
Creatinine, Ser: 0.84 mg/dL (ref 0.44–1.00)
GFR, Estimated: >60 mL/min (ref >60)
Glucose, Bld: 92 mg/dL (ref 70–99)
Potassium: 3.1 mmol/L — ABNORMAL LOW (ref 3.5–5.1)
Sodium: 136 mmol/L (ref 135–145)
Total Bilirubin: 0.9 mg/dL (ref 0.3–1.2)
Total Protein: 7.2 g/dL (ref 6.5–8.1)

## 2021-09-12 LAB — URINALYSIS, MICROSCOPIC (REFLEX): Bacteria, UA: NONE SEEN

## 2021-09-12 LAB — LIPASE, BLOOD: Lipase: 41 U/L (ref 11–51)

## 2021-09-12 LAB — I-STAT BETA HCG BLOOD, ED (MC, WL, AP ONLY): I-stat hCG, quantitative: <5 m[IU]/mL (ref <5)

## 2021-09-12 LAB — SARS CORONAVIRUS 2 (TAT 6-24 HRS): SARS Coronavirus 2: NEGATIVE

## 2021-09-12 MED ORDER — ONDANSETRON 4 MG PO TBDP: 4.0000 mg | ORAL_TABLET | Freq: Three times a day (TID) | ORAL | 0 refills | Status: DC | PRN

## 2021-09-12 MED ORDER — BENZONATATE 100 MG PO CAPS: 100.0000 mg | ORAL_CAPSULE | Freq: Three times a day (TID) | ORAL | 0 refills | Status: DC

## 2021-09-12 MED ORDER — POTASSIUM CHLORIDE 20 MEQ PO PACK
40.0000 meq | PACK | Freq: Every day | ORAL | Status: DC
  Administered 2021-09-12: 40 meq via ORAL
  Filled 2021-09-12: qty 2

## 2021-09-12 NOTE — Discharge Instructions (Signed)
The CT scan of your brain did not show any worrisome findings.  Take the medication if you have persistent vomiting.  Follow-up with your doctor within this week.  Return back to the ER if you cannot keep down any fluids or worsening symptoms difficulty breathing or any additional concerns.

## 2021-09-12 NOTE — ED Notes (Signed)
Patient transported to CT 

## 2021-09-12 NOTE — ED Triage Notes (Signed)
Pt reports 4 days of fever to 102, cough, bodyaches. Took home covid test negative. Per patient had syncopal episode at home yesterday. Pt awake, alert, appropriate. VSS.
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
## 2021-09-12 NOTE — ED Notes (Signed)
RN reviewed discharge instructions w/ pt. Follow up and medications reviewed, pt had no further questions °

## 2021-09-17 ENCOUNTER — Ambulatory Visit (INDEPENDENT_AMBULATORY_CARE_PROVIDER_SITE_OTHER): Payer: BC Managed Care – PPO | Admitting: Family Medicine

## 2021-09-17 ENCOUNTER — Other Ambulatory Visit: Payer: Self-pay

## 2021-09-17 VITALS — BP 100/65 | HR 67 | Temp 98.5°F | Ht 60.0 in | Wt 123.0 lb

## 2021-09-17 DIAGNOSIS — S060X9D Concussion with loss of consciousness of unspecified duration, subsequent encounter: Secondary | ICD-10-CM

## 2021-09-17 DIAGNOSIS — F0781 Postconcussional syndrome: Secondary | ICD-10-CM | POA: Diagnosis not present

## 2021-09-17 NOTE — Progress Notes (Signed)
SUBJECTIVE:   CHIEF COMPLAINT / HPI: ED follow-up for fall   ED follow-up for fall  Patient reports having fevers and body aches last week. She was evaluated at [facility] after having an episode of syncope. This week she presents due to difficulty focusing. She denies presence of a HA, reports having lingering light sensitivity and reports having delayed focus. She feels that she has trouble with reading comprehension, stating that it takes her longer to process her thoughts. Overall, she reports she has progressively been getting better.  She reports that her vision appears "cloudy" in her peripheral. She denies having difficulty with speech. She denies tinnitus. She reports having dizziness if she quickly turns her head to look in a different direction. She reports feeling nausea with moving. She reports having a hard time sleeping last night but reports she has been able to sleep most other nights. She is not sure how she hit her head during the fall. She does report having swelling on the left occiput of her head at the time of her injury.   PERTINENT  PMH / PSH:  Asthma  Sickle Cell trait  Anxiety   OBJECTIVE:   BP 100/65   Pulse 67   Temp 98.5 F (36.9 C)   Ht 5' (1.524 m)   Wt 123 lb (55.8 kg)   LMP  (LMP Unknown) Comment: implant just taken out last thursday  SpO2 98%   BMI 24.02 kg/m   Physical Exam Vitals reviewed.  Constitutional:      General: She is not in acute distress.    Appearance: Normal appearance. She is not ill-appearing, toxic-appearing or diaphoretic.  Eyes:     General: No scleral icterus.    Extraocular Movements: Extraocular movements intact.     Conjunctiva/sclera: Conjunctivae normal.     Pupils: Pupils are equal, round, and reactive to light.  Cardiovascular:     Rate and Rhythm: Normal rate and regular rhythm.     Heart sounds: Normal heart sounds.  Pulmonary:     Effort: Pulmonary effort is normal.     Breath sounds: Normal breath sounds. No  wheezing or rales.  Abdominal:     General: Bowel sounds are normal. There is no distension.     Palpations: Abdomen is soft.     Tenderness: There is no abdominal tenderness.  Neurological:     General: No focal deficit present.     Mental Status: She is alert and oriented to person, place, and time.     GCS: GCS eye subscore is 4. GCS verbal subscore is 5. GCS motor subscore is 6.     Cranial Nerves: No cranial nerve deficit.     Sensory: Sensation is intact. No sensory deficit.     Motor: Motor function is intact. No weakness, tremor, atrophy, abnormal muscle tone or pronator drift.     Coordination: Coordination is intact. Romberg sign negative. Coordination normal. Finger-Nose-Finger Test and Heel to [facility] Test normal.     Gait: Gait normal.  Psychiatric:        Mood and Affect: Mood normal.        Behavior: Behavior normal.    ASSESSMENT/PLAN:   Concussion Patient improved since initial presentation to the ED after syncopal episode. Patient denies presence of HA but appears to have issues with concentration.  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

## 2021-09-17 NOTE — Patient Instructions (Signed)
I believe that your continued symptoms are due to a concussion that she sustained at home when you had your fall.  I will write a letter for your work and school for you to have frequent breaks as well as recommending that you use filtered lenses when doing computer work.  I recommend that you do not stare disclaimed for longer than 15-20-minute intervals without a 5 to 10-minute break.  Concussion, Adult A concussion is a brain injury from a hard, direct hit (trauma) to your head or body. This direct hit causes your brain to quickly shake back and forth inside your skull. A concussion may also be called a mild traumatic brain injury (TBI). Healing from this injury can take time. What are the causes? This condition is caused by: A direct hit to your head, such as: Running into a player during a game. Being hit in a fight. Hitting your head on a hard surface. A quick and sudden movement of the head or neck, such as in a car crash. What are the signs or symptoms? The signs of a concussion can be hard to notice. They may be missed by you, family members, and doctors. You may look fine on the outside but may not act or feel normal. Physical symptoms Headaches. Being dizzy. Problems with body balance. Being sensitive to light or noise. Vomiting or feeling like you may vomit. Being tired. Problems seeing or hearing. Not sleeping or eating as you used to. Seizure. Mental and emotional symptoms Feeling grouchy (irritable). Having mood changes. Problems remembering things. Trouble focusing your mind (concentrating), organizing, or making decisions. Being slow to think, act, react, speak, or read. Feeling worried or nervous (anxious). Feeling sad (depressed). How is this treated? This condition may be treated by: Stopping sports or activity if you are injured. If you hit your head or have signs of concussion: Do not return to sports or activities the same day. Get checked by a doctor  before you return to your activities. Resting your body and your mind. Being watched carefully, often at home. Medicines to help with symptoms such as: Headaches. Feeling like you may vomit. Problems with sleep. Avoiding alcohol and drugs. Being asked to go to a concussion clinic or a place to help you recover (rehabilitation center). Recovery from a concussion can take time. Return to activities only: When you are fully healed. When your doctor says it is safe. Avoid taking strong pain medicines (opioids) for a concussion. Follow these instructions at home: Activity Limit activities that need a lot of thought or focus, such as: Homework or work for your job. Watching TV. Using the computer or phone. Playing memory games and puzzles. Rest. Rest helps your brain heal. Make sure you: Get plenty of sleep. Most adults should get 7-9 hours of sleep each night. Rest during the day. Take naps or breaks when you feel tired. Avoid activity like exercise until your doctor says its safe. Stop any activity that makes symptoms worse. Do not do activities that could cause a second concussion, such as riding a bike or playing sports. Ask your doctor when you can return to your normal activities, such as school, work, sports, and driving. Your ability to react may be slower. Do not do these activities if you are dizzy. General instructions  Take over-the-counter and prescription medicines only as told by your doctor. Do not drink alcohol until your doctor says you can. Watch your symptoms and tell other people to do the same. Other  problems can occur after a concussion. Older adults have a higher risk of serious problems. Tell your work Production designer, theatre/television/film, teachers, Tax adviser, school counselor, coach, or Event organiser about your injury and symptoms. Tell them about what you can or cannot do. Keep all follow-up visits as told by your doctor. This is important. How is this prevented? It is very important  that you do not get another brain injury. In rare cases, another injury can cause brain damage that will not go away, brain swelling, or death. The risk of this is greatest in the first 7-10 days after a head injury. To avoid injuries: Stop activities that could lead to a second concussion, such as contact sports, until your doctor says it is okay. When you return to sports or activities: Do not crash into other players. This is how most concussions happen. Follow the rules. Respect other players. Do not engage in violent behavior while playing. Get regular exercise. Do strength and balance training. Wear a helmet that fits you well during sports, biking, or other activities. Helmets can help protect you from serious skull and brain injuries, but they do not protect you from a concussion. Even when wearing a helmet, you should avoid being hit in the head. Contact a doctor if: Your symptoms do not get better. You have new symptoms. You have another injury. Get help right away if: You have bad headaches or your headaches get worse. You feel weak or numb in any part of your body. You feel mixed up (confused). Your balance gets worse. You vomit often. You feel more sleepy than normal. You cannot speak well, or have slurred speech. You have a seizure. Others have trouble waking you up. You have changes in how you act. You have changes in how you see (vision). You pass out (lose consciousness). These symptoms may be an emergency. Do not wait to see if the symptoms will go away. Get medical help right away. Call your local emergency services (911 in the [place] Do not drive yourself to the hospital. Summary A concussion is a brain injury from a hard, direct hit (trauma) to your head or body. This condition is treated with rest and careful watching of symptoms. Ask your doctor when you can return to your normal activities, such as school, work, or driving. Get help right away if you have a very  bad headache, feel weak in any part of your body, have a seizure, have changes in how you act or see, or if you are mixed up or more sleepy than normal. This information is not intended to replace advice given to you by your health care provider. Make sure you discuss any questions you have with your health care provider. Document Revised: 02/22/2021 Document Reviewed: 02/22/2021 Elsevier Patient Education  2022 [employer].

## 2021-09-22 DIAGNOSIS — S060XAA Concussion with loss of consciousness status unknown, initial encounter: Secondary | ICD-10-CM | POA: Insufficient documentation

## 2021-09-22 NOTE — Assessment & Plan Note (Signed)
Patient improved since initial presentation to the ED after syncopal episode. Patient denies presence of HA but appears to have issues with concentration.  Letter provided to allow for frequent breaks from computer work at work and for school  Patient counseled to follow up if symptoms do not improve in 2 weeks  Given no neurological deficits on exam, do not think patient warrants repeat imaging at this time

## 2021-11-09 ENCOUNTER — Ambulatory Visit (INDEPENDENT_AMBULATORY_CARE_PROVIDER_SITE_OTHER): Payer: BC Managed Care – PPO | Admitting: Family Medicine

## 2021-11-09 ENCOUNTER — Other Ambulatory Visit (HOSPITAL_COMMUNITY)
Admission: RE | Admit: 2021-11-09 | Discharge: 2021-11-09 | Disposition: A | Discharge status: Home / Self Care | Payer: BC Managed Care – PPO | Source: Ambulatory Visit | Attending: Family Medicine | Admitting: Family Medicine

## 2021-11-09 ENCOUNTER — Other Ambulatory Visit: Payer: Self-pay

## 2021-11-09 VITALS — BP 100/68 | HR 75 | Ht 60.0 in | Wt 121.2 lb

## 2021-11-09 DIAGNOSIS — Z124 Encounter for screening for malignant neoplasm of cervix: Secondary | ICD-10-CM | POA: Diagnosis present

## 2021-11-09 DIAGNOSIS — N898 Other specified noninflammatory disorders of vagina: Secondary | ICD-10-CM | POA: Insufficient documentation

## 2021-11-09 DIAGNOSIS — Z1159 Encounter for screening for other viral diseases: Secondary | ICD-10-CM | POA: Diagnosis not present

## 2021-11-09 DIAGNOSIS — Z114 Encounter for screening for human immunodeficiency virus [HIV]: Secondary | ICD-10-CM | POA: Diagnosis not present

## 2021-11-09 DIAGNOSIS — Z113 Encounter for screening for infections with a predominantly sexual mode of transmission: Secondary | ICD-10-CM | POA: Insufficient documentation

## 2021-11-09 LAB — POCT WET PREP (WET MOUNT)
Clue Cells Wet Prep Whiff POC: NEGATIVE
Trichomonas Wet Prep HPF POC: ABSENT

## 2021-11-09 NOTE — Progress Notes (Signed)
    SUBJECTIVE:   CHIEF COMPLAINT / HPI:   Vaginal odor Patient reports that she noted vaginal odor around [birth year] which [birth year] her [birth year].  She reports that she used some Monistat but she wants to make sure everything is okay.  She is sexually active with 1 partner and does not use protection.  No known contacts with an STD.  She would like a vaginal exam as well as blood work.  The patient is also in need of Pap smear which will because her previous 1 will expire in December.  We will go ahead and collect today.   OBJECTIVE:   BP 100/68   Pulse 75   Ht 5' (1.524 m)   Wt 121 lb 3.2 oz (55 kg)   LMP 10/23/2021 (Exact Date)   SpO2 98%   BMI 23.67 kg/m   General: Well-appearing 35 year old female, no acute distress Respiratory: Normal work of breathing, speaking in full sentences Abdomen: Soft, nontender Pelvic: Normal external female genitalia, normal internal vaginal tissue with white thickish discharge, no cervical erythema or friability, patient reports she did use Monistat which may be the cause of the thick white discharge.   ASSESSMENT/PLAN:   Screening for STD (sexually transmitted disease) Patient with new vaginal odor, has used Monistat because she thought she had a yeast infection.  Physical exam reassuring, collected wet prep which came back negative for BV, yeast, trichomoniasis.  Collected Pap smear and added GC/chlamydia as well as trichomonas.  We will call patient with results if any abnormalities are sent to MyChart.  Also collecting HIV and RPR.  Strict return precautions given.     Derrel Nip, MD [facility]

## 2021-11-09 NOTE — Patient Instructions (Signed)
It was wonderful seeing you today.  We collected a Pap smear as well as STD testing which will take a few days to come back.  We also collected a sample that we looked at under microscope and so no abnormalities.  We are getting blood work as well.  All these results will go back to your MyChart.  If you have any questions or concerns please call the clinic.  I hope you have a great day!

## 2021-11-09 NOTE — Assessment & Plan Note (Signed)
Patient with new vaginal odor, has used Monistat because she thought she had a yeast infection.  Physical exam reassuring, collected wet prep which came back negative for BV, yeast, trichomoniasis.  Collected Pap smear and added GC/chlamydia as well as trichomonas.  We will call patient with results if any abnormalities are sent to MyChart.  Also collecting HIV and RPR.  Strict return precautions given.

## 2021-11-10 LAB — RPR: RPR Ser Ql: NONREACTIVE

## 2021-11-10 LAB — HIV ANTIBODY (ROUTINE TESTING W REFLEX): HIV Screen 4th Generation wRfx: NONREACTIVE

## 2021-11-19 LAB — CYTOLOGY - PAP
Chlamydia: NEGATIVE
Comment: NEGATIVE
Comment: NEGATIVE
Comment: NEGATIVE
Comment: NORMAL
Diagnosis: NEGATIVE
Diagnosis: REACTIVE
High risk HPV: NEGATIVE
Neisseria Gonorrhea: NEGATIVE
Trichomonas: NEGATIVE

## 2022-05-05 IMAGING — CT CT HEAD W/O CM
3 series · 14 of 47 positions shown, 16 images · non-contrast
Comparison: None.

CLINICAL DATA: Head trauma, mod-severe

EXAM:
CT HEAD WITHOUT CONTRAST
TECHNIQUE: Contiguous axial images were obtained from the base of the skull
through the vertex without intravenous contrast.

[Series 3: head 5.0 mpr ax · axial · 0.33mm/px · z∈[-185,-60]mm · 8 of 31 slices shown, 10 images]
[im 3/31  brain]
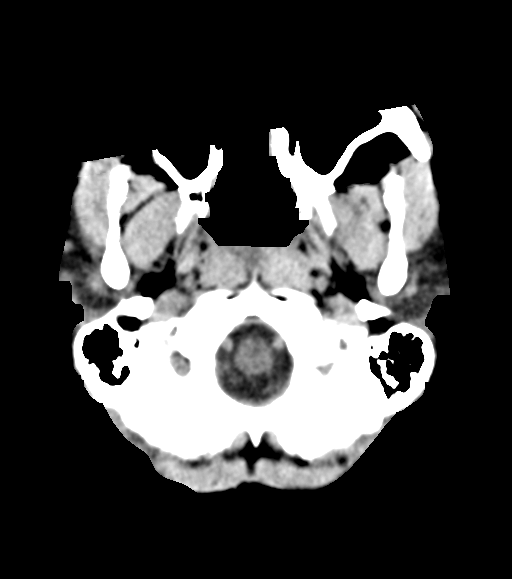
[im 3/31  bone]
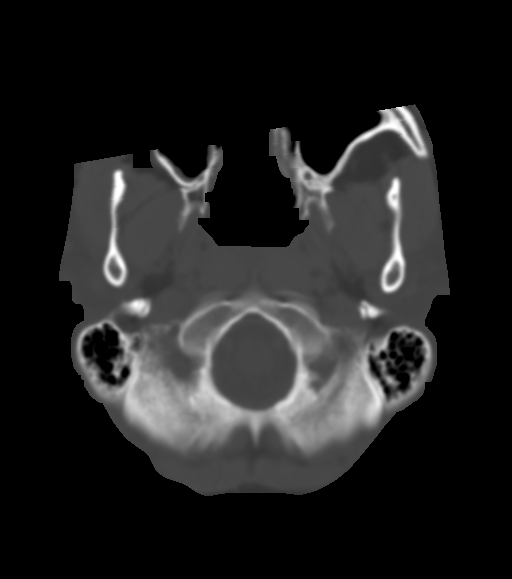
[im 7/31  brain]
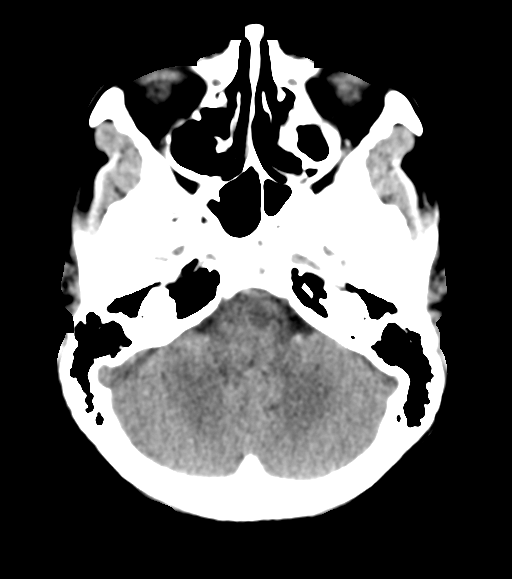
[im 10/31  brain]
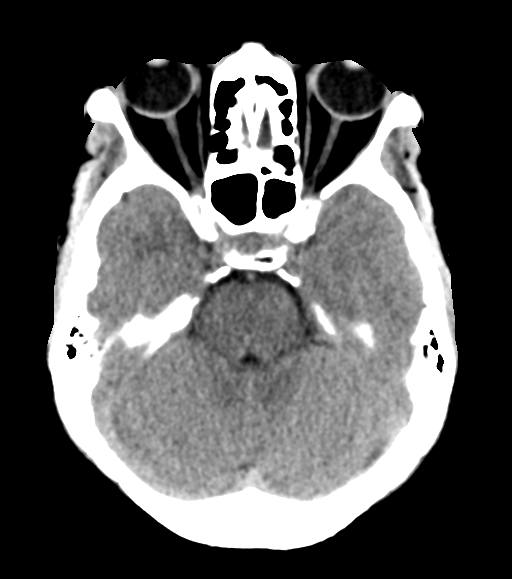
[im 14/31  brain]
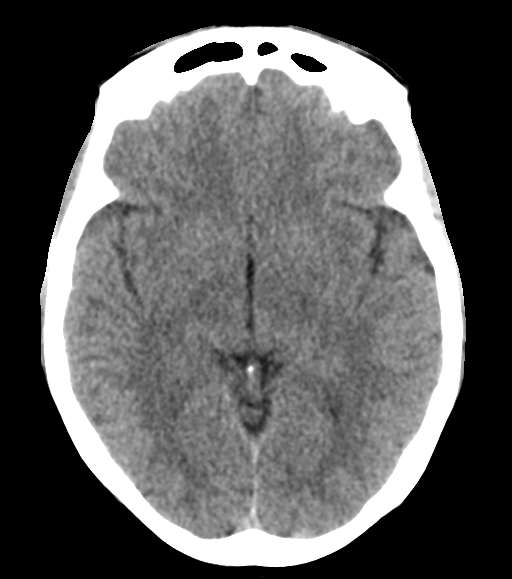
[im 17/31  brain]
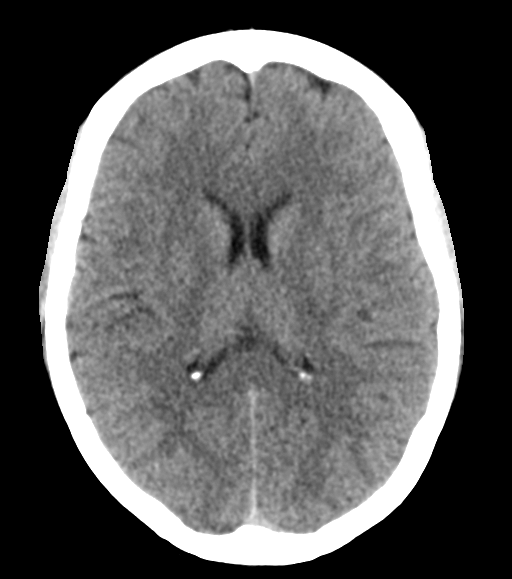
[im 17/31  bone]
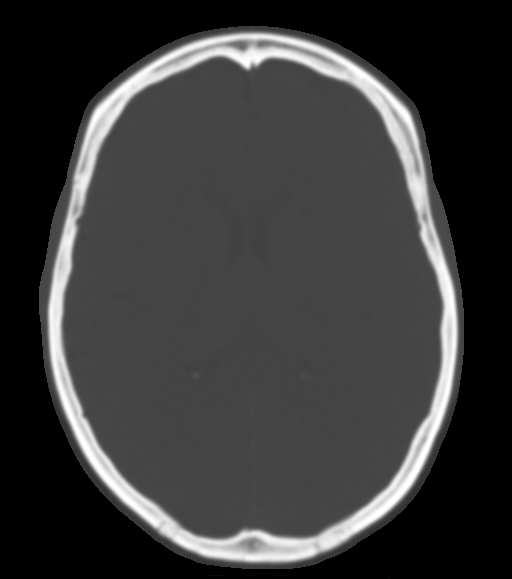
[im 21/31  brain]
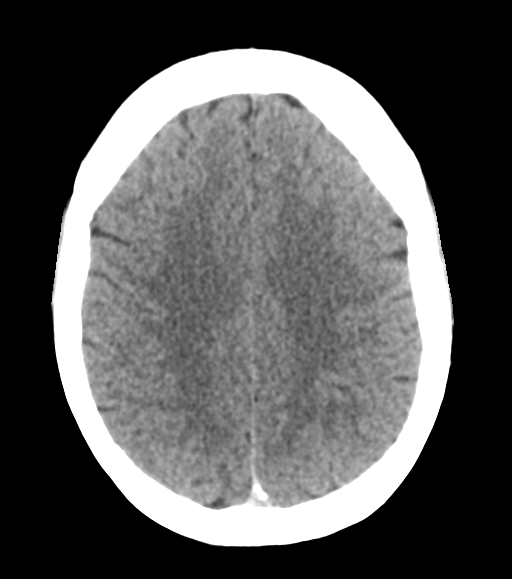
[im 24/31  brain]
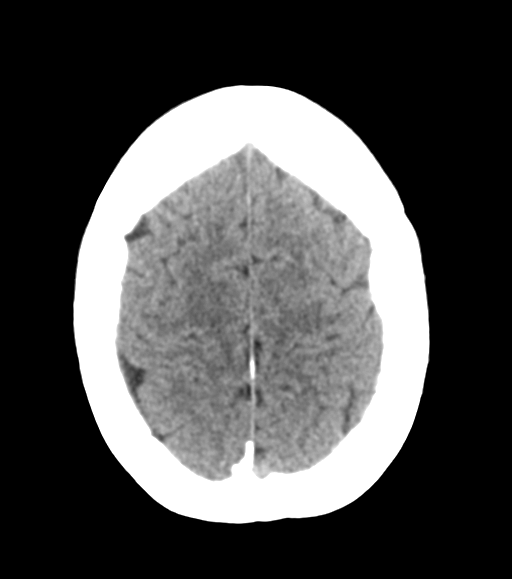
[im 28/31  brain]
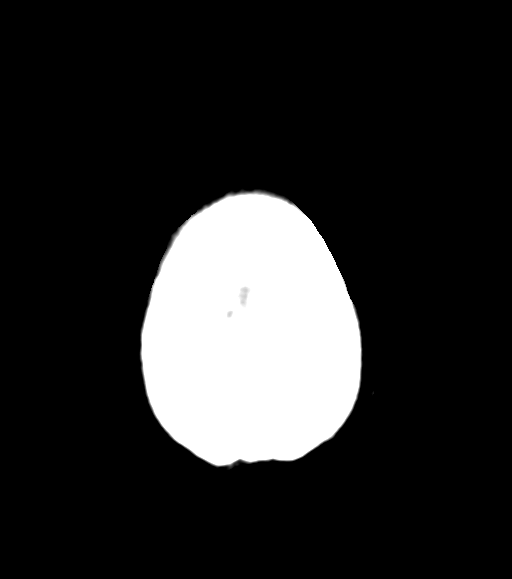

[Series 6: head 3.0 mpr cor · coronal · 0.30mm/px · 3 of 66 slices shown]
[im 22/66  brain]
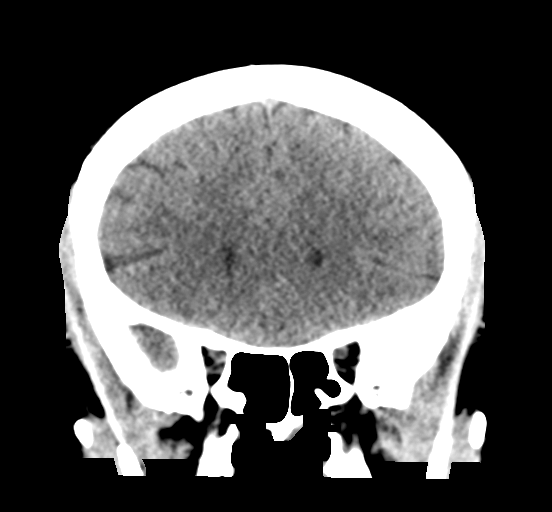
[im 29/66  brain]
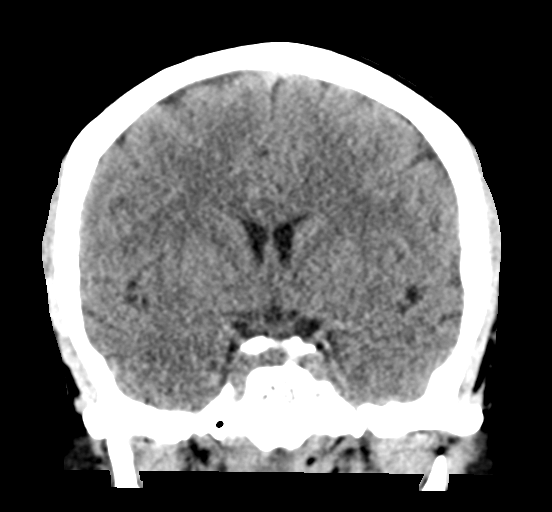
[im 37/66  brain]
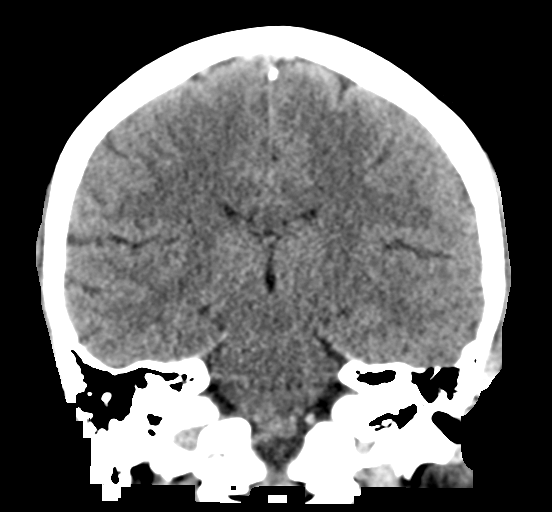

[Series 7: head 3.0 mpr sag · sagittal · 0.30mm/px · 3 of 57 slices shown]
[im 19/57  brain]
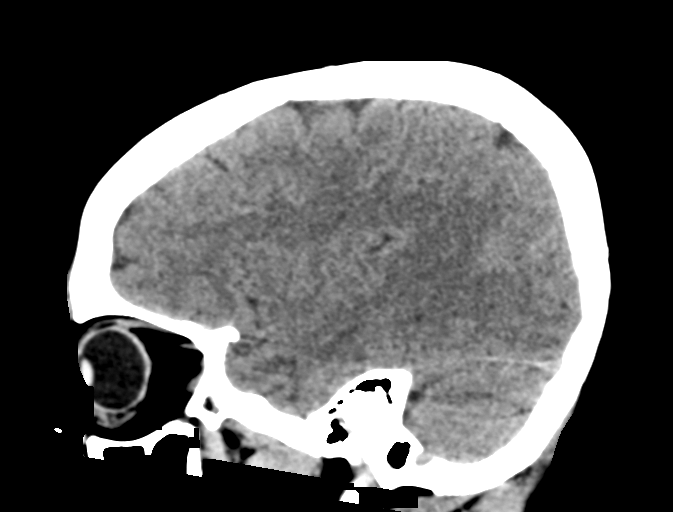
[im 29/57  brain]
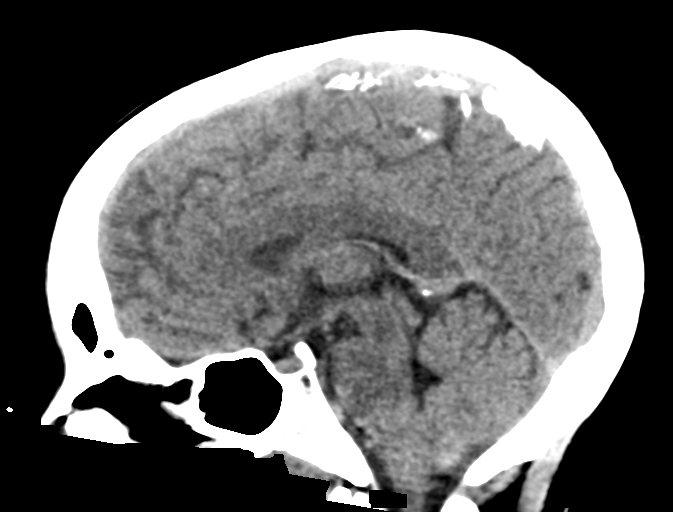
[im 38/57  brain]
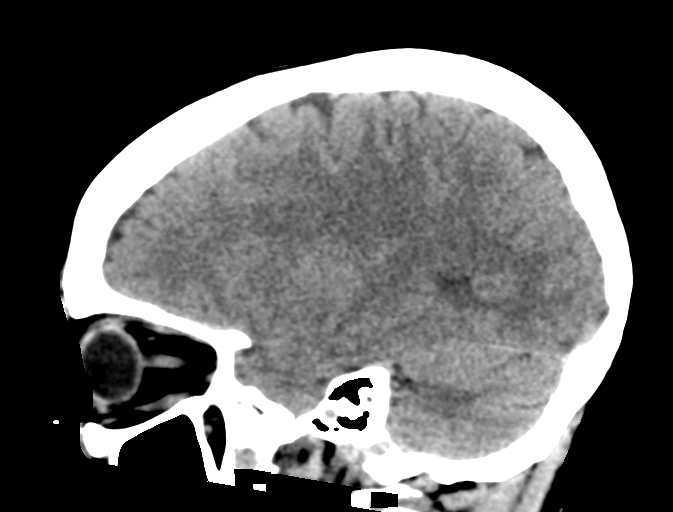

[14 of 47 positions shown; findings below may reference images not displayed]

FINDINGS: Brain: No evidence of acute infarction, hemorrhage, hydrocephalus,
extra-axial collection or mass lesion/mass effect.

Vascular: No hyperdense vessel identified.

Skull: No acute fracture.

Sinuses/Orbits: Mild paranasal sinus mucosal thickening with frothy
secretions in a posterior right ethmoid air cell. No acute orbital
findings.

Other: Partially imaged adenoid hypertrophy.  No mastoid effusions.
IMPRESSION: No evidence of acute intracranial abnormality.

## 2022-05-28 ENCOUNTER — Encounter: Payer: Self-pay | Admitting: *Deleted

## 2022-08-29 ENCOUNTER — Other Ambulatory Visit (HOSPITAL_COMMUNITY)
Admission: RE | Admit: 2022-08-29 | Discharge: 2022-08-29 | Disposition: A | Discharge status: Home / Self Care | Payer: BC Managed Care – PPO | Source: Ambulatory Visit | Attending: Family Medicine | Admitting: Family Medicine

## 2022-08-29 ENCOUNTER — Ambulatory Visit (INDEPENDENT_AMBULATORY_CARE_PROVIDER_SITE_OTHER): Payer: BC Managed Care – PPO | Admitting: Family Medicine

## 2022-08-29 ENCOUNTER — Encounter: Payer: Self-pay | Admitting: Family Medicine

## 2022-08-29 VITALS — BP 104/81 | HR 63 | Ht 60.0 in | Wt 118.0 lb

## 2022-08-29 DIAGNOSIS — Z113 Encounter for screening for infections with a predominantly sexual mode of transmission: Secondary | ICD-10-CM

## 2022-08-29 DIAGNOSIS — Z3041 Encounter for surveillance of contraceptive pills: Secondary | ICD-10-CM | POA: Diagnosis not present

## 2022-08-29 DIAGNOSIS — N760 Acute vaginitis: Secondary | ICD-10-CM | POA: Diagnosis not present

## 2022-08-29 MED ORDER — NORGESTIMATE-ETH ESTRADIOL 0.25-35 MG-MCG PO TABS: 1.0000 | ORAL_TABLET | Freq: Every day | ORAL | 11 refills | Status: DC

## 2022-08-29 NOTE — Progress Notes (Signed)
    SUBJECTIVE:   CHIEF COMPLAINT / HPI:   STI Testing -Requests routine STI testing -New sexual partner -No known exposure -No symptoms (no vaginal irritation, discharge, odor, no urinary symptoms) -No prior history of STIs -Would also like HIV/RPR in addition to GC/chlamydia/trich swab -Using OCPs (Sprintec) for contraception, LMP 8/28  PERTINENT  PMH / PSH: sickle cell trait  OBJECTIVE:   BP 104/81   Pulse 63   Ht 5' (1.524 m)   Wt 118 lb (53.5 kg)   LMP 08/19/2022 (Exact Date)   SpO2 100%   BMI 23.05 kg/m   General: NAD, pleasant, able to participate in exam Respiratory: No respiratory distress Skin: warm and dry, no rashes noted Psych: Normal affect and mood Neuro: grossly intact GU/GYN: Exam performed in the presence of a chaperone. External genitalia within normal limits.  Vaginal mucosa pink, moist, normal rugae.  Nonfriable cervix without lesions, no discharge or bleeding noted on speculum exam.   ASSESSMENT/PLAN:   Screening for STD (sexually transmitted disease) Routine screening done today for GC/chlamydia/trich. Patient to return on 9/11 for lab visit for HIV, RPR, and Hep C. Future orders placed. Will follow up with results.   UTD on Pap  Refill sent on Sprintec per patient request.  Maury Dus, MD [facility]

## 2022-08-29 NOTE — Patient Instructions (Addendum)
Today we did routine testing for sexually transmitted infections. I will send you a MyChart message with the results.  Come back Monday, 9/11 at 8:30 am for blood work.  Take care, Dr Anner Crete

## 2022-08-29 NOTE — Assessment & Plan Note (Addendum)
Routine screening done today for GC/chlamydia/trich. Patient to return on 9/11 for lab visit for HIV, RPR, and Hep C. Future orders placed. Will follow up with results.

## 2022-09-02 ENCOUNTER — Telehealth: Payer: Self-pay

## 2022-09-02 ENCOUNTER — Other Ambulatory Visit: Payer: BC Managed Care – PPO

## 2022-09-02 DIAGNOSIS — Z113 Encounter for screening for infections with a predominantly sexual mode of transmission: Secondary | ICD-10-CM

## 2022-09-02 LAB — CERVICOVAGINAL ANCILLARY ONLY
Bacterial Vaginitis (gardnerella): POSITIVE — AB
Chlamydia: NEGATIVE
Comment: NEGATIVE
Comment: NEGATIVE
Comment: NEGATIVE
Comment: NORMAL
Neisseria Gonorrhea: NEGATIVE
Trichomonas: NEGATIVE

## 2022-09-02 MED ORDER — METRONIDAZOLE 500 MG PO TABS: 500.0000 mg | ORAL_TABLET | Freq: Two times a day (BID) | ORAL | 0 refills | Status: AC

## 2022-09-02 NOTE — Addendum Note (Signed)
Addended by: Maury Dus on: 09/02/2022 04:34 PM   Modules accepted: Orders

## 2022-09-02 NOTE — Telephone Encounter (Signed)
Received message from front desk regarding patient. Patient is asking if medication is going to be sent to pharmacy. She was able to see positive results for BV via mychart. She is requesting that prescription be sent to [facility] on [address].   Forwarding to Dr. Anner Crete.   Veronda Prude, RN

## 2022-09-02 NOTE — Telephone Encounter (Signed)
Rx sent for Flagyl 500mg  BID x7 days. Results communicated via MyChart.   , MD PGY-3, [facility]

## 2022-09-03 LAB — HEPATITIS C ANTIBODY: Hep C Virus Ab: NONREACTIVE

## 2022-09-03 LAB — HIV ANTIBODY (ROUTINE TESTING W REFLEX): HIV Screen 4th Generation wRfx: NONREACTIVE

## 2022-09-03 LAB — RPR: RPR Ser Ql: NONREACTIVE

## 2022-12-23 NOTE — L&D Delivery Note (Signed)
I was called by nursing and given report that fetal parts were protruding from the vagina I cam down to the room and attended the delivery of a macerated non viable fetus at 0128 Apgars 0/0/0 Cord clamped, placenta was still in uterus, no bleeding Fetus appeared to have passed more than a few days ago Cytotec 800 micrograms given buccally Conservative approach to placental delivery  Lazaro Arms, MD 10/17/2023 1:42 AM

## 2023-07-08 ENCOUNTER — Ambulatory Visit: Payer: BC Managed Care – PPO

## 2023-07-08 VITALS — BP 120/70 | HR 77 | Ht 60.0 in | Wt 112.4 lb

## 2023-07-08 DIAGNOSIS — Z3A01 Less than 8 weeks gestation of pregnancy: Secondary | ICD-10-CM

## 2023-07-08 DIAGNOSIS — Z3200 Encounter for pregnancy test, result unknown: Secondary | ICD-10-CM

## 2023-07-08 DIAGNOSIS — Z3490 Encounter for supervision of normal pregnancy, unspecified, unspecified trimester: Secondary | ICD-10-CM | POA: Insufficient documentation

## 2023-07-08 LAB — POCT URINE PREGNANCY: Preg Test, Ur: POSITIVE — AB

## 2023-07-08 NOTE — Assessment & Plan Note (Signed)
Upreg positive, congratulated patient. Discussed previous pregnancies in H&P, obtained initial prenatal labs and scheduled initial visit. Follow up with Dr. Sherrilee Gilles.

## 2023-07-08 NOTE — Progress Notes (Cosign Needed)
  SUBJECTIVE:   CHIEF COMPLAINT / HPI:   Confirm Pregnancy -Patient reports that she is pregnant  -First day of LMP 06/10/23-normal and regular cycle, no BC over last 6 months.  -Nausea/Vomiting: None at present  -Wanted pregnancy: Very excited and has two boys at home but does not have a preference in gender  -[id number]  -No cramping or vaginal bleeding   Two previous deliveries-both c-sections  Both caesarian-- attempted vaginal delivery but stalled and had to transition to CS  Last pregnancy 10 years ago Would like to attempt VBAC   PERTINENT  PMH / PSH:  Asthma Anxiety    OBJECTIVE:  BP 120/70   Pulse 77   Ht 5' (1.524 m)   Wt 112 lb 6 oz (51 kg)   LMP 06/10/2023   SpO2 99%   BMI 21.95 kg/m  Physical Exam  General: Alert and oriented in no apparent distress Heart: Regular rate and rhythm with no murmurs appreciated Lungs: Normal WOB Skin: Warm and dry Extremities: No lower extremity edema  ASSESSMENT/PLAN:  Possible pregnancy -     POCT urine pregnancy  Less than [redacted] weeks gestation of pregnancy Assessment & Plan: Upreg positive, congratulated patient. Discussed previous pregnancies in H&P, obtained initial prenatal labs and scheduled initial visit. Follow up with Dr. Sherrilee Gilles.   Orders: -     CBC/D/Plt+RPR+Rh+ABO+RubIgG...    Return in about 2 weeks (around 07/22/2023) for Initial OB visit. Alfredo Martinez, MD 07/08/2023, 3:26 PM PGY-3, [facility]

## 2023-07-08 NOTE — Patient Instructions (Addendum)
It was great to see you today! Thank you for choosing [facility] for your obstetric care. Morgan Todd was seen to confirm pregnancy  Congratulations!!  Tentative due date 03/17/23 Please schedule an initial OB visit at the front    MOMS TO BE  Activity: Moms-to-be must be very diligent about getting enough exercise, as staying active will not only help you keep your weight gains to a healthy level, but can also help you manage your symptoms, and strengthen your body for labor.  Nutrition: Even though prenatal vitamins contain lots of vitamins and nutrients you need, it's important to reinforce this nutritional need through healthy eating. Pregnant women should do their best to eat plenty of fresh fruits, veggies, and meats, and avoid eating overly fatty or processed foods, as the body works best with only natural fuels.  Sleep: Getting the right amount of ZZZs is imperative during pregnancy, as it allows your body to recover to the strength it needs to help baby develop healthily, and can help you manage your toughest symptoms.  Blood pressure: Abnormal blood pressure, whether high or low, can both be dangerous for you and Baby during pregnancy, so it's very important to monitor your BP readings so you'll know that all is well.   Genetic Screening Options For you  NIPT stands for noninvasive prenatal testing. It's a screening test offered during pregnancy to see if the fetus is at risk for having a chromosomal disorder like Down syndrome (trisomy 29), trisomy 39 (Edwards syndrome) and trisomy 14 (Patau syndrome). The test can also determine the sex of the fetus.   Go to the MAU at [facility] at [facility] if: You have pain in your lower abdomen or pelvic area Your water breaks.  Sometimes it is a big gush of fluid, sometimes it is just a trickle that keeps getting your underwear wet or running down your legs You have vaginal bleeding.   Thank you for allowing  me to participate in your care, Alfredo Martinez, MD

## 2023-07-10 LAB — CBC/D/PLT+RPR+RH+ABO+RUBIGG...
Antibody Screen: NEGATIVE
Basophils Absolute: 0 10*3/uL (ref 0.0–0.2)
Basos: 1 %
Bilirubin, UA: NEGATIVE
EOS (ABSOLUTE): 0.1 10*3/uL (ref 0.0–0.4)
Eos: 1 %
Glucose, UA: NEGATIVE
HCV Ab: NONREACTIVE
HIV Screen 4th Generation wRfx: NONREACTIVE
Hematocrit: 42.3 % (ref 34.0–46.6)
Hemoglobin: 14.2 g/dL (ref 11.1–15.9)
Hepatitis B Surface Ag: NEGATIVE
Immature Grans (Abs): 0 10*3/uL (ref 0.0–0.1)
Immature Granulocytes: 0 %
Ketones, UA: NEGATIVE
Leukocytes,UA: NEGATIVE
Lymphocytes Absolute: 2.6 10*3/uL (ref 0.7–3.1)
Lymphs: 38 %
MCH: 30.8 pg (ref 26.6–33.0)
MCHC: 33.6 g/dL (ref 31.5–35.7)
MCV: 92 fL (ref 79–97)
Monocytes Absolute: 0.5 10*3/uL (ref 0.1–0.9)
Monocytes: 8 %
Neutrophils Absolute: 3.5 10*3/uL (ref 1.4–7.0)
Neutrophils: 52 %
Nitrite, UA: NEGATIVE
Platelets: 189 10*3/uL (ref 150–450)
RBC, UA: NEGATIVE
RBC: 4.61 x10E6/uL (ref 3.77–5.28)
RDW: 12.4 % (ref 11.7–15.4)
RPR Ser Ql: NONREACTIVE
Rh Factor: POSITIVE
Rubella Antibodies, IGG: 20.9 index (ref >0.99)
Specific Gravity, UA: 1.018 (ref 1.005–1.030)
Urobilinogen, Ur: 1 mg/dL (ref 0.2–1.0)
WBC: 6.7 10*3/uL (ref 3.4–10.8)
pH, UA: 8 — ABNORMAL HIGH (ref 5.0–7.5)

## 2023-07-10 LAB — MICROSCOPIC EXAMINATION
Bacteria, UA: NONE SEEN
Casts: NONE SEEN /lpf
WBC, UA: NONE SEEN /hpf (ref 0–5)

## 2023-07-10 LAB — HCV INTERPRETATION

## 2023-07-10 LAB — URINE CULTURE, OB REFLEX

## 2023-07-17 ENCOUNTER — Encounter: Payer: Self-pay | Admitting: Student

## 2023-07-18 ENCOUNTER — Other Ambulatory Visit: Payer: Self-pay

## 2023-07-18 ENCOUNTER — Ambulatory Visit (INDEPENDENT_AMBULATORY_CARE_PROVIDER_SITE_OTHER): Payer: BC Managed Care – PPO | Admitting: Family Medicine

## 2023-07-18 DIAGNOSIS — Z3A01 Less than 8 weeks gestation of pregnancy: Secondary | ICD-10-CM | POA: Diagnosis not present

## 2023-07-18 DIAGNOSIS — Z3401 Encounter for supervision of normal first pregnancy, first trimester: Secondary | ICD-10-CM | POA: Diagnosis not present

## 2023-07-18 MED ORDER — PRENATAL VITAMIN 27-0.8 MG PO TABS: 1.0000 | ORAL_TABLET | Freq: Every day | ORAL | 1 refills | Status: AC

## 2023-07-18 NOTE — Progress Notes (Unsigned)
Patient Name: Morgan Todd Date of Birth: [birth year] [facility] Initial Prenatal Visit  Morgan Todd is a 37 y.o. year old [id number] at Unknown who presents for her initial prenatal visit. Pregnancy is planned She reports breast tenderness. She is not taking a prenatal vitamin.  She denies pelvic pain or vaginal bleeding.   Pregnancy Dating: The patient is dated by LMP.  LMP: 06/10/2023. She tracks on an app.  Period is certain:  Yes.  Periods were regular:  Yes.  LMP was a typical period:  No, this was slightly lighter than usual.  Using hormonal contraception in 3 months prior to conception: No  Lab Review: Blood type: A Rh Status: + Antibody screen: Negative HIV: Negative RPR: Negative Hemoglobin electrophoresis reviewed: Yes Results of OB urine culture are: Positive for mixed flora Rubella: Immune Hep C Ab: Negative Varicella status is Immune, she has hx of chickenpox infxn  PMH: Reviewed and as detailed below: HTN: No  Gestational Hypertension/preeclampsia: No  Type 1 or 2 Diabetes: No  Depression:  No  Seizure disorder:  No VTE: No ,  History of STI No,  Abnormal Pap smear:  Yes, in remote past, but last 2 were normal Genital herpes simplex:  No   PSH: Gynecologic Surgery:  no Surgical history reviewed, notable for: has 2 prior C sections.   -Had csection in past due to failure to progress, both times.    Obstetric History: Obstetric history tab updated and reviewed.  Summary of prior pregnancies: 2 term pregnancies delivered via c-section for failure to progress. Cesarean delivery: Yes  Gestational Diabetes:  No Hypertension in pregnancy: No History of preterm birth: No History of LGA/SGA infant:  No History of shoulder dystocia: No Indications for referral were reviewed, and the patient has no obstetric indications for referral to High Risk OB Clinic at this time.   Social History: Partner's name: Roberto Scales  Tobacco use:  No Alcohol use:  No Other substance use:  No  Current Medications:  Not currently taking any medications.   Reviewed and appropriate in pregnancy.   Genetic and Infection Screen: Flow Sheet Updated {yes/no:20286::"Yes"}  Prenatal Exam: Gen: Well nourished, well developed.  No distress.  Vitals noted. HEENT: Normocephalic, atraumatic.  Neck supple. CV: RRR no murmur, gallops or rubs Lungs: CTA B.  Normal respiratory effort without wheezes or rales. Abd: soft, NTND. +BS.  Uterus not appreciated above pelvis. Ext: No clubbing, cyanosis or edema. Psych: Normal grooming and dress.  Not depressed or anxious appearing.  Normal thought content and process without flight of ideas or looseness of associations  Fetal heart tones:  too early to detect*  Assessment/Plan:  DESHANNON Todd is a 37 y.o. [id number] at Unknown who presents to initiate prenatal care. She is doing well.  Current pregnancy issues include ***.  Routine prenatal care: As dating is reliable, a dating ultrasound has not been ordered. Dating tab updated. Pre-pregnancy weight updated. Expected weight gain this pregnancy is 25-35 pounds  Prenatal labs reviewed, notable for ***. Indications for referral to HROB were reviewed and the patient does not meet criteria for referral.  Medication list reviewed and updated.  Recommended patient see a dentist for regular care.  Bleeding and pain precautions reviewed. Importance of prenatal vitamins reviewed.  Genetic screening offered. Patient opted for: patient undecided, will address at future visit. The patient has the following indications for aspirinto begin 81 mg at 12-16 weeks: One high risk condition: no single high risk  condition  MORE than one moderate risk condition: Age 68 or older  and identifies as African American  Aspirin may be indicated at 12wks given presence of 2 moderate risk factors The patient will be age 15 or over at time of delivery. Referral to genetic  counseling was not offered today.  The patient has the following risk factors for preexisting diabetes: Reviewed indications for early 1 hour glucose testing, not indicated . An early 1 hour glucose tolerance test was not ordered. Pregnancy Medical Home and PHQ-9 forms completed, problems noted: {yes/no:20286}  2. Pregnancy issues include the following which were addressed today:  ***   Follow up 4 weeks for next prenatal visit.

## 2023-07-18 NOTE — Patient Instructions (Signed)
Good to see you today - Thank you for coming in  Things we discussed today:  1) The results from your first visit were normal.   2) Start taking a prenatal vitamin once a day. I have sent one to your pharmacy.  3) Come back to see me in 4 weeks.

## 2023-07-20 NOTE — Assessment & Plan Note (Signed)
  NURSING  PROVIDER  Office Location [facility] Dating by LMP 06/10/23    Anatomy U/S   Initiated care at  4wks                Language  English              LAB RESULTS   Support Person  Genetics NIPS:             AFP:                NT/IT (FT only)     Carrier Screen Horizon:  Rhogam  A/Positive/-- (07/16 1448) A1C/GTT Early:             Third trimester:  Flu Vaccine     TDaP Vaccine   Blood Type A/Positive/-- (07/16 1448)  Covid Vaccine  Antibody Negative (07/16 1448)    Rubella 20.90 (07/16 1448)  Feeding Plan  RPR Non Reactive (07/16 1448)  Contraception  HBsAg Negative (07/16 1448)  Circumcision If female, yes HIV Non Reactive (07/16 1448)  Pediatrician   HCVAb Non Reactive (07/16 1448)  Prenatal Classes       Pap Diagnosis  Date Value Ref Range Status  11/09/2021   Final   - Negative for Intraepithelial Lesions or Malignancy (NILM)  11/09/2021 - Benign reactive/reparative changes  Final    BTLConsent  GC/CT Initial:  neg           36wks:  VBAC  Consent Needs GBS   For PCN allergy, check sensitivities   Aspirin indicated?     DME Rx [ ]  BP cuff [ ]  Weight Scale Waterbirth  [ ]  Class [ ]  Consent [ ]  CNM visit  PHQ9 & GAD7 [  ] new OB [  ] 28 weeks  [  ] 36 weeks Induction  [ ]  Orders Entered [ ] Foley Y/N

## 2023-07-29 ENCOUNTER — Inpatient Hospital Stay (HOSPITAL_COMMUNITY): Payer: BC Managed Care – PPO

## 2023-07-29 ENCOUNTER — Encounter (HOSPITAL_COMMUNITY): Payer: Self-pay | Admitting: *Deleted

## 2023-07-29 ENCOUNTER — Inpatient Hospital Stay (HOSPITAL_COMMUNITY)
Admission: AD | Admit: 2023-07-29 | Discharge: 2023-07-29 | Disposition: A | Discharge status: Home / Self Care | Payer: BC Managed Care – PPO | Source: Home / Self Care | Attending: Obstetrics and Gynecology | Admitting: Obstetrics and Gynecology

## 2023-07-29 DIAGNOSIS — O209 Hemorrhage in early pregnancy, unspecified: Secondary | ICD-10-CM

## 2023-07-29 DIAGNOSIS — O208 Other hemorrhage in early pregnancy: Secondary | ICD-10-CM | POA: Diagnosis present

## 2023-07-29 DIAGNOSIS — O3680X0 Pregnancy with inconclusive fetal viability, not applicable or unspecified: Secondary | ICD-10-CM

## 2023-07-29 DIAGNOSIS — O26891 Other specified pregnancy related conditions, first trimester: Secondary | ICD-10-CM | POA: Insufficient documentation

## 2023-07-29 DIAGNOSIS — Z3A01 Less than 8 weeks gestation of pregnancy: Secondary | ICD-10-CM | POA: Diagnosis not present

## 2023-07-29 LAB — URINALYSIS, ROUTINE W REFLEX MICROSCOPIC
Bilirubin Urine: NEGATIVE
Glucose, UA: NEGATIVE mg/dL
Ketones, ur: NEGATIVE mg/dL
Leukocytes,Ua: NEGATIVE
Nitrite: NEGATIVE
Protein, ur: NEGATIVE mg/dL
Specific Gravity, Urine: 1.013 (ref 1.005–1.030)
pH: 6 (ref 5.0–8.0)

## 2023-07-29 LAB — WET PREP, GENITAL
Clue Cells Wet Prep HPF POC: NONE SEEN
Sperm: NONE SEEN
Trich, Wet Prep: NONE SEEN
WBC, Wet Prep HPF POC: <10 (ref <10)

## 2023-07-29 LAB — CBC
HCT: 32 % — ABNORMAL LOW (ref 36.0–46.0)
Hemoglobin: 11.3 g/dL — ABNORMAL LOW (ref 12.0–15.0)
MCH: 31.2 pg (ref 26.0–34.0)
MCHC: 35.3 g/dL (ref 30.0–36.0)
MCV: 88.4 fL (ref 80.0–100.0)
Platelets: 245 10*3/uL (ref 150–400)
RBC: 3.62 MIL/uL — ABNORMAL LOW (ref 3.87–5.11)
RDW: 11.7 % (ref 11.5–15.5)
WBC: 6.1 10*3/uL (ref 4.0–10.5)
nRBC: 0 % (ref 0.0–0.2)

## 2023-07-29 LAB — TYPE AND SCREEN
ABO/RH(D): A POS
Antibody Screen: NEGATIVE

## 2023-07-29 LAB — HCG, QUANTITATIVE, PREGNANCY: hCG, Beta Chain, Quant, S: 24733 m[IU]/mL — ABNORMAL HIGH (ref <5)

## 2023-07-29 NOTE — MAU Note (Addendum)
.  Morgan Todd is a 37 y.o. at [redacted]w[redacted]d here in MAU reporting being [redacted]wks pregnant and "I woke up in a puddle of blood" just now. No pain. States went to [facility] once here and it was less amt and lighter color LMP: 06/10/23 Onset of complaint: tonight Pain score: 0 Vitals:   07/29/23 2015 07/29/23 2018  BP:  103/70  Pulse: 72   Resp: 17   Temp: 98.9 F (37.2 C)   SpO2: 100%      FHT:n/a Lab orders placed from triage:  none

## 2023-07-29 NOTE — Progress Notes (Signed)
Wynelle Bourgeois CNM in Triage to see pt and discuss test results and d/c plan. Written and verbal d/c instructions given and understanding voiced.

## 2023-07-30 ENCOUNTER — Encounter: Payer: Self-pay | Admitting: Student

## 2023-07-31 ENCOUNTER — Other Ambulatory Visit: Payer: Self-pay | Admitting: Student

## 2023-07-31 MED ORDER — MICONAZOLE 3 200 MG VA SUPP: 200.0000 mg | Freq: Every day | VAGINAL | 0 refills | Status: DC

## 2023-07-31 MED ORDER — VAGINAL SUPPOSITORY APPLICATOR MISC: 1.0000 | Freq: Every day | 0 refills | Status: DC

## 2023-07-31 NOTE — Addendum Note (Signed)
Addended by: Glendale Chard on: 07/31/2023 01:44 PM   Modules accepted: Orders

## 2023-08-15 ENCOUNTER — Other Ambulatory Visit: Payer: Self-pay

## 2023-08-15 ENCOUNTER — Ambulatory Visit (INDEPENDENT_AMBULATORY_CARE_PROVIDER_SITE_OTHER): Payer: BC Managed Care – PPO | Admitting: Student

## 2023-08-15 VITALS — BP 103/56 | HR 82 | Temp 98.2°F | Wt 115.4 lb

## 2023-08-15 DIAGNOSIS — J320 Chronic maxillary sinusitis: Secondary | ICD-10-CM

## 2023-08-15 DIAGNOSIS — Z3A09 9 weeks gestation of pregnancy: Secondary | ICD-10-CM

## 2023-08-15 DIAGNOSIS — J4520 Mild intermittent asthma, uncomplicated: Secondary | ICD-10-CM

## 2023-08-15 MED ORDER — ALBUTEROL SULFATE HFA 108 (90 BASE) MCG/ACT IN AERS
2.0000 | INHALATION_SPRAY | Freq: Four times a day (QID) | RESPIRATORY_TRACT | 1 refills | Status: AC | PRN
Start: 2023-08-15 — End: ?

## 2023-08-15 MED ORDER — FLUTICASONE PROPIONATE 50 MCG/ACT NA SUSP: 2.0000 | Freq: Every day | NASAL | 6 refills | Status: AC

## 2023-08-15 NOTE — Patient Instructions (Addendum)
It was great to see you today!   We will discuss starting you an a baby aspirin at your next visit!   Future Appointments  Date Time Provider Department Center  09/01/2023  4:00 PM Glendale Chard, DO [facility]    Please arrive 15 minutes before your appointment to ensure smooth check in process.    Please call the clinic at [phone] if your symptoms worsen or you have any concerns.  Thank you for allowing me to participate in your care, Dr. Glendale Chard [facility]    Pregnancy Related Return Precautions The follow are signs/symptoms that are abnormal in pregnancy and may require further evaluation by a physician: Go to the MAU at [facility] at [facility] if: You have cramping/contractions that do not go away with drinking water, especially if they are lasting 30 seconds to 1.5 minutes, coming and going every 5-10 minutes for an hour or more, or are getting stronger and you cannot walk or talk while having a contraction/cramp. Your water breaks.  Sometimes it is a big gush of fluid, sometimes it is just a trickle that keeps getting your underwear wet or running down your legs You have vaginal bleeding.    You do not feel your baby moving like normal.  If you do not, get something to eat and drink (something cold or something with sugar like peanut butter or juice) and lay down and focus on feeling your baby move. If your baby is still not moving like normal, you should go to MAU. You should feel your baby move 6 times in one hour, or 10 times in two hours. You have a persistent headache that does not go away with 1 g of Tylenol, vision changes, chest pain, difficulty breathing, severe pain in your right upper abdomen, worsening leg swelling- these can all be signs of high blood pressure in pregnancy and need to be evaluated by a provider immediately  These are all concerning in pregnancy and if you have any of these I recommend you call your PCP and present  to the Maternity Admissions Unit (map below) for further evaluation.  For any pregnancy-related emergencies, please go to the Maternity Admissions Unit in the [facility] Center at [facility]. You will use hospital Entrance C.    Our clinic number is [phone].   Dr Miquel Dunn

## 2023-08-15 NOTE — Progress Notes (Cosign Needed)
  Patient Name: Morgan Todd Date of Birth: Mar 08, 1986 [facility] Prenatal Visit  Kathye KYRIEL ZONG is a 37 y.o. [id number] at [redacted]w[redacted]d here for routine follow up. She is dated by LMP.  She reports bleeding.  She denies vaginal bleeding.  See flow sheet for details.  Vitals:   08/15/23 1544  BP: (!) 103/56  Pulse: 82  Temp: 98.2 F (36.8 C)     A/P: Pregnancy at [redacted]w[redacted]d.  Doing well.    Routine Prenatal Care:  Dating reviewed, dating tab is correct Fetal heart tones Appropriate Influenza vaccine not administered as not influenza season.   COVID vaccination was discussed and not given today.  The patient has the following indication for screening preexisting diabetes: Reviewed indications for early 1 hour glucose testing, not indicated . Anatomy ultrasound ordered to be scheduled at 18-20 weeks. Patient is interested in genetic screening. As she is past 13 weeks and 6 days, a Quad screen  was offered.  Pregnancy education including expected weight gain in pregnancy, OTC medication use, continued use of prenatal vitamin, smoking cessation if applicable, and nutrition in pregnancy.   Bleeding and pain precautions reviewed. The patient has the following indications for aspirinto begin 81 mg at 12-16 weeks: One high risk condition: no single high risk condition  MORE than one moderate risk condition: Age 80 or older , identifies as African American , and more than 10 years since last pregnancy  Aspirin was  recommended today based upon above risk factors (one high risk condition or more than one moderate risk factor)   2. Pregnancy issues include the following and were addressed as appropriate today:  Subchorionic hemorrhage: continuing to monitor bleeding, patient denies cramping or passing clots. She has been avoiding strenuous activity. Will discuss starting ASA at next visit if bleeding has resolved.  Prior C-section: will need referral to OB/GYN for TOLAC discussion in 3rd  trimester  Would like genetic screening, will order at next visit Problem list  and pregnancy box updated: Yes.   Follow up 4 weeks. Appointment scheduled.

## 2023-09-01 ENCOUNTER — Ambulatory Visit (INDEPENDENT_AMBULATORY_CARE_PROVIDER_SITE_OTHER): Payer: BC Managed Care – PPO | Admitting: Student

## 2023-09-01 VITALS — BP 117/84 | HR 91 | Wt 111.0 lb

## 2023-09-01 DIAGNOSIS — Z3A11 11 weeks gestation of pregnancy: Secondary | ICD-10-CM

## 2023-09-01 DIAGNOSIS — O09511 Supervision of elderly primigravida, first trimester: Secondary | ICD-10-CM

## 2023-09-01 DIAGNOSIS — O09521 Supervision of elderly multigravida, first trimester: Secondary | ICD-10-CM

## 2023-09-01 NOTE — Patient Instructions (Signed)
Please make a follow up appointment in 4 weeks.  Pregnancy Related Return Precautions The follow are signs/symptoms that are abnormal in pregnancy and may require further evaluation by a physician: Go to the MAU at [facility] at [place] if: You have cramping/contractions that do not go away with drinking water, especially if they are lasting 30 seconds to 1.5 minutes, coming and going every 5-10 minutes for an hour or more, or are getting stronger and you cannot walk or talk while having a contraction/cramp. Your water breaks.  Sometimes it is a big gush of fluid, sometimes it is just a trickle that keeps getting your underwear wet or running down your legs You have vaginal bleeding.    You do not feel your baby moving like normal.  If you do not, get something to eat and drink (something cold or something with sugar like peanut butter or juice) and lay down and focus on feeling your baby move. If your baby is still not moving like normal, you should go to MAU. You should feel your baby move 6 times in one hour, or 10 times in two hours. You have a persistent headache that does not go away with 1 g of Tylenol, vision changes, chest pain, difficulty breathing, severe pain in your right upper abdomen, worsening leg swelling- these can all be signs of high blood pressure in pregnancy and need to be evaluated by a provider immediately  These are all concerning in pregnancy and if you have any of these I recommend you call your PCP and present to the Maternity Admissions Unit (map below) for further evaluation.  For any pregnancy-related emergencies, please go to the Maternity Admissions Unit in the Women's & Children's Center at [facility]. You will use hospital Entrance C.    Our clinic number is [phone].   Dr Pray  

## 2023-09-01 NOTE — Progress Notes (Cosign Needed Addendum)
  Patient Name: Morgan Todd Date of Birth: Jun 25, 1986 [facility] Prenatal Visit  [name] is a 37 y.o. [id number] at [redacted]w[redacted]d here for routine follow up. She is dated by LMP.  She reports no complaints.  She denies vaginal bleeding.  See flow sheet for details.  Vitals:   09/01/23 1607  BP: 117/84  Pulse: 91     A/P: Pregnancy at [redacted]w[redacted]d.  Doing well.    Routine Prenatal Care:  Dating reviewed, dating tab is correct Fetal heart tones Appropriate Influenza vaccine not administered as patient declined, will continue to discuss.   COVID vaccination was discussed and declined.  The patient has the following indication for screening preexisting diabetes: Reviewed indications for early 1 hour glucose testing, not indicated . Anatomy ultrasound ordered to be scheduled at 18-20 weeks. Patient is interested in genetic screening. As she is past 13 weeks and 6 days, a Referral to genetic counseling as she is 35 or over at time of delivery  was offered.  Pregnancy education including expected weight gain in pregnancy, OTC medication use, continued use of prenatal vitamin, smoking cessation if applicable, and nutrition in pregnancy.   Bleeding and pain precautions reviewed. The patient has the following indications for aspirinto begin 81 mg at 12-16 weeks: One high risk condition: no single high risk condition  MORE than one moderate risk condition: Age 79 or older , identifies as African American , and more than 10 years since last pregnancy  Aspirin was  recommended today based upon above risk factors (one high risk condition or more than one moderate risk factor)   2. Pregnancy issues include the following and were addressed as appropriate today:  Starting ASA 81 mg today, obtained [facility] genetic screening Problem list  and pregnancy box updated: Yes.   Follow up 4 weeks.  Referral to Genetic counseling at next visit and cell free DNA testing >16 weeks.

## 2023-09-03 NOTE — Addendum Note (Signed)
Addended by: Glendale Chard on: 09/03/2023 12:03 PM   Modules accepted: Orders

## 2023-09-04 ENCOUNTER — Other Ambulatory Visit: Payer: BC Managed Care – PPO

## 2023-09-04 DIAGNOSIS — Z3A11 11 weeks gestation of pregnancy: Secondary | ICD-10-CM

## 2023-09-09 LAB — PANORAMA PRENATAL TEST FULL PANEL:PANORAMA TEST PLUS 5 ADDITIONAL MICRODELETIONS: FETAL FRACTION: 7.1

## 2023-09-17 LAB — HORIZON 4 (SMA, CF, FRAGILE X, DMD)
CYSTIC FIBROSIS: NEGATIVE
DUCHENNE/BECKER MUSCULAR DYSTROPHY: NEGATIVE
FRAGILE X SYNDROME: NEGATIVE
REPORT SUMMARY: NEGATIVE
SPINAL MUSCULAR ATROPHY: NEGATIVE

## 2023-10-01 ENCOUNTER — Other Ambulatory Visit: Payer: Self-pay

## 2023-10-01 ENCOUNTER — Ambulatory Visit: Payer: BC Managed Care – PPO | Admitting: Student

## 2023-10-01 VITALS — BP 113/66 | HR 66 | Temp 97.9°F | Wt 115.0 lb

## 2023-10-01 DIAGNOSIS — Z3A16 16 weeks gestation of pregnancy: Secondary | ICD-10-CM

## 2023-10-01 NOTE — Patient Instructions (Addendum)
For nausea you can take: Unasom and B6 supplementation. They are over the counter!   Please make a follow up appointment in 4 weeks.  Pregnancy Related Return Precautions The follow are signs/symptoms that are abnormal in pregnancy and may require further evaluation by a physician: Go to the MAU at [facility] if: You have cramping/contractions that do not go away with drinking water, especially if they are lasting 30 seconds to 1.5 minutes, coming and going every 5-10 minutes for an hour or more, or are getting stronger and you cannot walk or talk while having a contraction/cramp. Your water breaks.  Sometimes it is a big gush of fluid, sometimes it is just a trickle that keeps getting your underwear wet or running down your legs You have vaginal bleeding.    You do not feel your baby moving like normal.  If you do not, get something to eat and drink (something cold or something with sugar like peanut butter or juice) and lay down and focus on feeling your baby move. If your baby is still not moving like normal, you should go to MAU. You should feel your baby move 6 times in one hour, or 10 times in two hours. You have a persistent headache that does not go away with 1 g of Tylenol, vision changes, chest pain, difficulty breathing, severe pain in your right upper abdomen, worsening leg swelling- these can all be signs of high blood pressure in pregnancy and need to be evaluated by a provider immediately  These are all concerning in pregnancy and if you have any of these I recommend you call your PCP and present to the Maternity Admissions Unit (map below) for further evaluation.  For any pregnancy-related emergencies, please go to the Maternity Admissions Unit in the [facility] at [facility]. You will use hospital Entrance C.    Our clinic number is [phone].   Dr Miquel Dunn

## 2023-10-01 NOTE — Progress Notes (Signed)
  Patient Name: Morgan Todd Date of Birth: September 16, 1986 [facility] Prenatal Visit  Luda ORETHA WEISMANN is a 37 y.o. [id number] at [redacted]w[redacted]d here for routine follow up. She is dated by LMP confirmed by early [place].  She reports nausea and vomiting.  She denies vaginal bleeding.  See flow sheet for details.  Vitals:   10/01/23 1053  BP: 113/66  Pulse: 66  Temp: 97.9 F (36.6 C)     A/P: Pregnancy at [redacted]w[redacted]d.  Doing well.    Routine Prenatal Care:  Dating reviewed, dating tab is correct Fetal heart tones Appropriate Influenza vaccine not administered as patient declined, will continue to discuss.   COVID vaccination was discussed and declined.  The patient has the following indication for screening preexisting diabetes: Reviewed indications for early 1 hour glucose testing, not indicated . Anatomy ultrasound ordered to be scheduled at 18-20 weeks. Patient is interested in genetic screening. As she is past 13 weeks and 6 days, a Quad screen  was offered.  Pregnancy education including expected weight gain in pregnancy, OTC medication use, continued use of prenatal vitamin, smoking cessation if applicable, and nutrition in pregnancy.   Bleeding and pain precautions reviewed. The patient has the following indications for aspirinto begin 81 mg at 12-16 weeks: One high risk condition: no single high risk condition  MORE than one moderate risk condition: Age 89 or older , identifies as African American , and more than 10 years since last pregnancy  Aspirin was  recommended today based upon above risk factors (one high risk condition or more than one moderate risk factor)   2. Pregnancy issues include the following and were addressed as appropriate today:  Vomiting: Discussed using Unisom and vitamin B6.  If still having difficulty can consider prescribing Zofran Anatomy ultrasound scheduled Problem list  and pregnancy box updated: Yes.   Follow up 4 weeks.

## 2023-10-10 DIAGNOSIS — Z98891 History of uterine scar from previous surgery: Secondary | ICD-10-CM | POA: Insufficient documentation

## 2023-10-10 DIAGNOSIS — O34219 Maternal care for unspecified type scar from previous cesarean delivery: Secondary | ICD-10-CM | POA: Insufficient documentation

## 2023-10-10 DIAGNOSIS — O09529 Supervision of elderly multigravida, unspecified trimester: Secondary | ICD-10-CM | POA: Insufficient documentation

## 2023-10-15 ENCOUNTER — Inpatient Hospital Stay (HOSPITAL_COMMUNITY)
Admission: AD | Admit: 2023-10-15 | Discharge: 2023-10-15 | Disposition: A | Discharge status: Home / Self Care | Payer: BC Managed Care – PPO | Source: Home / Self Care | Attending: Obstetrics and Gynecology | Admitting: Obstetrics and Gynecology

## 2023-10-15 ENCOUNTER — Encounter (HOSPITAL_COMMUNITY): Payer: Self-pay | Admitting: Obstetrics and Gynecology

## 2023-10-15 ENCOUNTER — Inpatient Hospital Stay (HOSPITAL_COMMUNITY): Payer: BC Managed Care – PPO

## 2023-10-15 DIAGNOSIS — O039 Complete or unspecified spontaneous abortion without complication: Secondary | ICD-10-CM

## 2023-10-15 DIAGNOSIS — O021 Missed abortion: Secondary | ICD-10-CM

## 2023-10-15 DIAGNOSIS — O26852 Spotting complicating pregnancy, second trimester: Secondary | ICD-10-CM | POA: Insufficient documentation

## 2023-10-15 DIAGNOSIS — Z3A18 18 weeks gestation of pregnancy: Secondary | ICD-10-CM | POA: Diagnosis not present

## 2023-10-15 DIAGNOSIS — N939 Abnormal uterine and vaginal bleeding, unspecified: Secondary | ICD-10-CM

## 2023-10-15 LAB — TSH: TSH: 1.146 u[IU]/mL (ref 0.350–4.500)

## 2023-10-15 LAB — CBC WITH DIFFERENTIAL/PLATELET
Abs Immature Granulocytes: 0.02 10*3/uL (ref 0.00–0.07)
Basophils Absolute: 0 10*3/uL (ref 0.0–0.1)
Basophils Relative: 0 %
Eosinophils Absolute: 0.1 10*3/uL (ref 0.0–0.5)
Eosinophils Relative: 1 %
HCT: 32.9 % — ABNORMAL LOW (ref 36.0–46.0)
Hemoglobin: 11.6 g/dL — ABNORMAL LOW (ref 12.0–15.0)
Immature Granulocytes: 0 %
Lymphocytes Relative: 35 %
Lymphs Abs: 2.9 10*3/uL (ref 0.7–4.0)
MCH: 31.4 pg (ref 26.0–34.0)
MCHC: 35.3 g/dL (ref 30.0–36.0)
MCV: 89.2 fL (ref 80.0–100.0)
Monocytes Absolute: 0.6 10*3/uL (ref 0.1–1.0)
Monocytes Relative: 8 %
Neutro Abs: 4.7 10*3/uL (ref 1.7–7.7)
Neutrophils Relative %: 56 %
Platelets: 293 10*3/uL (ref 150–400)
RBC: 3.69 MIL/uL — ABNORMAL LOW (ref 3.87–5.11)
RDW: 12.4 % (ref 11.5–15.5)
WBC: 8.3 10*3/uL (ref 4.0–10.5)
nRBC: 0 % (ref 0.0–0.2)

## 2023-10-15 LAB — T4, FREE: Free T4: 0.75 ng/dL (ref 0.61–1.12)

## 2023-10-15 LAB — HEMOGLOBIN A1C
Hgb A1c MFr Bld: 4.8 % (ref 4.8–5.6)
Mean Plasma Glucose: 91.06 mg/dL

## 2023-10-15 NOTE — MAU Provider Note (Signed)
Vaginal Bleeding     S Ms. Morgan Todd is a 37 y.o. [phone] pregnant female at [redacted]w[redacted]d who presents to MAU today with complaint of a week of VB.  Patient states exactly one week ago she passed a long finger sized blood clot after intercourse.  Since that incident she has continued to experience dark red to dark brown vaginal spotting, "smear" on her pad.  She states it smells like "metal blood." Denies CP, SOB, F/C, abd pain, pelvic cramping / pressure, .   Receives care at [facility]. Prenatal records reviewed.  Pertinent items noted in HPI and remainder of comprehensive ROS otherwise negative.   O BP 101/64   Pulse 61   Temp 98 F (36.7 C) (Oral)   Resp 16   Ht 5\' 1"  (1.549 m)   Wt 52 kg   LMP 06/10/2023   SpO2 100%   BMI 21.65 kg/m  Physical Exam Vitals and nursing note reviewed.  Constitutional:      General: She is not in acute distress.    Appearance: Normal appearance. She is normal weight. She is not ill-appearing.  HENT:     Head: Normocephalic and atraumatic.     Right Ear: External ear normal.     Left Ear: External ear normal.     Nose: Nose normal.     Mouth/Throat:     Mouth: Mucous membranes are moist.  Eyes:     Extraocular Movements: Extraocular movements intact.     Conjunctiva/sclera: Conjunctivae normal.  Cardiovascular:     Rate and Rhythm: Normal rate.  Pulmonary:     Effort: Pulmonary effort is normal. No respiratory distress.  Abdominal:     General: Abdomen is flat. There is no distension.     Tenderness: There is no abdominal tenderness.  Musculoskeletal:        General: Normal range of motion.     Cervical back: Normal range of motion.  Skin:    General: Skin is warm and dry.  Neurological:     Mental Status: She is alert and oriented to person, place, and time. Mental status is at baseline.  Psychiatric:        Mood and Affect: Mood normal.        Behavior: Behavior normal.        Thought Content: Thought content normal.         Judgment: Judgment normal.      MDM: MAU Course:  Patient complaining of continuous bleeding for one week after passing finger sized blood clot after coitus.  Nursing concerned no FHR on doppler in triage.  Bedside US concerning for missed abortion given no fetal cardiac activity identified. Dr. Berton Lan informed after ordering formal US.  The formal US showed no fetal cardiac activity.  Ordered TORCH labs given gestational age as well as thyroid, A1c, and routine infectious work up. See Dr. Aneta Mins attestation for more details on counseling.   A&P: #[redacted] weeks gestation #Vaginal Bleeding  #IUFD - Stable for discharge at this time given stable, minimal bleeding, no fevers / abdominal pain - Message to [facility] for virtual check in appointment for 1 week time     Discharge from MAU in stable condition with strict/usual precautions   Allergies as of 10/15/2023   No Known Allergies      Medication List     TAKE these medications    albuterol 108 (90 Base) MCG/ACT inhaler Commonly known as: VENTOLIN HFA Inhale 2 puffs into the  lungs every 6 (six) hours as needed for wheezing or shortness of breath.   fluticasone 50 MCG/ACT nasal spray Commonly known as: FLONASE Place 2 sprays into both nostrils daily.   Prenatal Vitamin 27-0.8 MG Tabs Take 1 tablet by mouth daily.        Hessie Dibble, MD 10/15/2023 12:19 PM

## 2023-10-15 NOTE — MAU Note (Signed)
Chaplain at bedside

## 2023-10-15 NOTE — MAU Note (Signed)
...  Morgan Todd is a 37 y.o. at [redacted]w[redacted]d here in MAU reporting: Exactly one week of vaginal bleeding. She reports the first day of bleeding she passed one long finger sized blood clot after intercourse and since then she has been experiencing dark red to dark brown vaginal spotting. She reports getting a "smear" on her pad. Reports it smells like "metal blood." Denies vaginal itching and irritation.  Anatomy scan scheduled for Oct 31st. Next office appointment on 11/4.  Onset of complaint: One week ago Pain score: Denies pain.  FHT: unable to doppler FHT Lab orders placed from triage: none

## 2023-10-16 ENCOUNTER — Other Ambulatory Visit: Payer: Self-pay

## 2023-10-16 ENCOUNTER — Other Ambulatory Visit (HOSPITAL_COMMUNITY): Payer: Self-pay | Admitting: Family Medicine

## 2023-10-16 ENCOUNTER — Inpatient Hospital Stay (HOSPITAL_COMMUNITY)
Admission: AD | Admit: 2023-10-16 | Discharge: 2023-10-18 | DRG: 770 | Disposition: A | Discharge status: Home / Self Care | Payer: BC Managed Care – PPO | Attending: Obstetrics & Gynecology | Admitting: Obstetrics & Gynecology

## 2023-10-16 ENCOUNTER — Encounter (HOSPITAL_COMMUNITY): Payer: Self-pay | Admitting: Obstetrics & Gynecology

## 2023-10-16 DIAGNOSIS — J45909 Unspecified asthma, uncomplicated: Secondary | ICD-10-CM | POA: Diagnosis present

## 2023-10-16 DIAGNOSIS — Z87891 Personal history of nicotine dependence: Secondary | ICD-10-CM

## 2023-10-16 DIAGNOSIS — Z823 Family history of stroke: Secondary | ICD-10-CM | POA: Diagnosis not present

## 2023-10-16 DIAGNOSIS — O34211 Maternal care for low transverse scar from previous cesarean delivery: Secondary | ICD-10-CM | POA: Diagnosis not present

## 2023-10-16 DIAGNOSIS — O021 Missed abortion: Principal | ICD-10-CM | POA: Diagnosis present

## 2023-10-16 DIAGNOSIS — O364XX0 Maternal care for intrauterine death, not applicable or unspecified: Secondary | ICD-10-CM | POA: Diagnosis not present

## 2023-10-16 DIAGNOSIS — F419 Anxiety disorder, unspecified: Secondary | ICD-10-CM | POA: Diagnosis present

## 2023-10-16 DIAGNOSIS — Z98891 History of uterine scar from previous surgery: Secondary | ICD-10-CM

## 2023-10-16 DIAGNOSIS — Z3A18 18 weeks gestation of pregnancy: Secondary | ICD-10-CM | POA: Diagnosis not present

## 2023-10-16 DIAGNOSIS — Z832 Family history of diseases of the blood and blood-forming organs and certain disorders involving the immune mechanism: Secondary | ICD-10-CM | POA: Diagnosis not present

## 2023-10-16 DIAGNOSIS — O09523 Supervision of elderly multigravida, third trimester: Secondary | ICD-10-CM | POA: Diagnosis not present

## 2023-10-16 HISTORY — DX: Missed abortion: O02.1

## 2023-10-16 LAB — RPR: RPR Ser Ql: NONREACTIVE

## 2023-10-16 LAB — TYPE AND SCREEN
ABO/RH(D): A POS
Antibody Screen: NEGATIVE

## 2023-10-16 LAB — TOXOPLASMA GONDII ANTIBODY, IGG: Toxoplasma IgG Ratio: <3 [IU]/mL (ref 0.0–7.1)

## 2023-10-16 LAB — HSV 2 ANTIBODY, IGG: HSV 2 Glycoprotein G Ab, IgG: NONREACTIVE

## 2023-10-16 LAB — HSV 1 ANTIBODY, IGG: HSV 1 Glycoprotein G Ab, IgG: NONREACTIVE

## 2023-10-16 LAB — CBC
HCT: 30.8 % — ABNORMAL LOW (ref 36.0–46.0)
Hemoglobin: 10.8 g/dL — ABNORMAL LOW (ref 12.0–15.0)
MCH: 31.1 pg (ref 26.0–34.0)
MCHC: 35.1 g/dL (ref 30.0–36.0)
MCV: 88.8 fL (ref 80.0–100.0)
Platelets: 289 10*3/uL (ref 150–400)
RBC: 3.47 MIL/uL — ABNORMAL LOW (ref 3.87–5.11)
RDW: 12.6 % (ref 11.5–15.5)
WBC: 7.2 10*3/uL (ref 4.0–10.5)
nRBC: 0 % (ref 0.0–0.2)

## 2023-10-16 LAB — RUBELLA SCREEN: Rubella: 20.1 {index} (ref >0.99)

## 2023-10-16 LAB — CMV ANTIBODY, IGG (EIA): CMV Ab - IgG: 8.7 U/mL — ABNORMAL HIGH (ref 0.00–0.59)

## 2023-10-16 MED ORDER — MISOPROSTOL 200 MCG PO TABS
400.0000 ug | ORAL_TABLET | Freq: Once | ORAL | Status: AC
  Administered 2023-10-16: 400 ug via VAGINAL
  Filled 2023-10-16: qty 2

## 2023-10-16 MED ORDER — FENTANYL CITRATE (PF) 100 MCG/2ML IJ SOLN
50.0000 ug | INTRAMUSCULAR | Status: DC | PRN
  Administered 2023-10-16: 50 ug via INTRAVENOUS
  Administered 2023-10-17 (×4): 100 ug via INTRAVENOUS
  Filled 2023-10-16 (×5): qty 2

## 2023-10-16 MED ORDER — LACTATED RINGERS IV SOLN
125.0000 mL/h | INTRAVENOUS | Status: AC
Start: 2023-10-17 — End: 2023-10-17
  Administered 2023-10-17: 125 mL/h via INTRAVENOUS

## 2023-10-16 MED ORDER — DOCUSATE SODIUM 100 MG PO CAPS
100.0000 mg | ORAL_CAPSULE | Freq: Every day | ORAL | Status: DC
  Administered 2023-10-17: 100 mg via ORAL
  Filled 2023-10-16 (×2): qty 1

## 2023-10-16 MED ORDER — ACETAMINOPHEN 325 MG PO TABS
650.0000 mg | ORAL_TABLET | ORAL | Status: DC | PRN
  Administered 2023-10-16: 650 mg via ORAL
  Filled 2023-10-16: qty 2

## 2023-10-16 MED ORDER — CALCIUM CARBONATE ANTACID 500 MG PO CHEW: 2.0000 | CHEWABLE_TABLET | ORAL | Status: DC | PRN

## 2023-10-16 MED ORDER — MISOPROSTOL 200 MCG PO TABS
200.0000 ug | ORAL_TABLET | ORAL | Status: DC
  Administered 2023-10-16 – 2023-10-17 (×3): 200 ug via VAGINAL
  Filled 2023-10-16 (×3): qty 1
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
## 2023-10-16 NOTE — MAU Note (Signed)
Morgan Todd is a 37 y.o. at [redacted]w[redacted]d here in MAU reporting: dx yesterday with IUFD.  Continues to spot. Denies pain.  Here to discuss plan and options.  Onset of complaint: ongoing Pain score: none Vitals:   10/16/23 1150  BP: 104/70  Pulse: 84  Resp: 16  Temp: 99 F (37.2 C)  SpO2: 100%      Lab orders placed from triage:     Taken to family rm

## 2023-10-16 NOTE — Progress Notes (Signed)
Chaplain responded to [facility] page for missed miscarriaged. Morgan Todd was alone and learned that her 18wk baby, Morgan Todd, had died in utero. Chaplain asked open ended questions to facilitate story telling and emotional expression. She reports having felt the baby move last night. Very tearful. Chaplain utilized reflective listening to explore the various options and next steps. Morgan Todd initially could not imagine going home and thought she wanted to surgically manage her loss as soon as possible, but was eager for her partner to arrive to see what he wanted to do. Chaplain shared that some families find it helpful to go home and manage their childcare needs or have some time to absorb the news of the loss and then return on their own time.  Her partner arrived and I supported them their grief including advocating for another ultrasound for his benefit and affirming Morgan Todd's personhood through storytelling and life review. FOB shared that he was excited for their first boy and was shocked to hear the news because he was "playing" with him just last night. The couple ultimately decided they would like to go home to have some time to make a decision on their own terms. Chaplain utilized reflective listening to identify challenges as well as sources of strength and coping strategies. Provided resources for follow up grief support in case they dont' come back through [facility]cc.  Please page as further needs arise.  Morgan Todd. Morgan Todd, M.Div. [facility] Chaplain Pager [phone] Office [phone]
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
## 2023-10-17 ENCOUNTER — Inpatient Hospital Stay (HOSPITAL_COMMUNITY): Payer: BC Managed Care – PPO | Admitting: Anesthesiology

## 2023-10-17 ENCOUNTER — Encounter (HOSPITAL_COMMUNITY)
Admission: AD | Disposition: A | Discharge status: Home / Self Care | Payer: Self-pay | Source: Home / Self Care | Attending: Obstetrics & Gynecology

## 2023-10-17 ENCOUNTER — Encounter (HOSPITAL_COMMUNITY): Payer: Self-pay | Admitting: Obstetrics & Gynecology

## 2023-10-17 DIAGNOSIS — O09523 Supervision of elderly multigravida, third trimester: Secondary | ICD-10-CM

## 2023-10-17 DIAGNOSIS — O34211 Maternal care for low transverse scar from previous cesarean delivery: Secondary | ICD-10-CM

## 2023-10-17 DIAGNOSIS — Z3A18 18 weeks gestation of pregnancy: Secondary | ICD-10-CM

## 2023-10-17 DIAGNOSIS — O364XX0 Maternal care for intrauterine death, not applicable or unspecified: Secondary | ICD-10-CM

## 2023-10-17 HISTORY — PX: DILATION AND EVACUATION: SHX1459

## 2023-10-17 SURGERY — DILATION AND EVACUATION, UTERUS
Anesthesia: General

## 2023-10-17 MED ORDER — PROPOFOL 10 MG/ML IV BOLUS
INTRAVENOUS | Status: AC
  Filled 2023-10-17: qty 20

## 2023-10-17 MED ORDER — IBUPROFEN 600 MG PO TABS
600.0000 mg | ORAL_TABLET | Freq: Four times a day (QID) | ORAL | Status: DC
  Administered 2023-10-17 – 2023-10-18 (×3): 600 mg via ORAL
  Filled 2023-10-17 (×3): qty 1

## 2023-10-17 MED ORDER — MIDAZOLAM HCL 2 MG/2ML IJ SOLN
INTRAMUSCULAR | Status: DC | PRN
  Administered 2023-10-17: 2 mg via INTRAVENOUS

## 2023-10-17 MED ORDER — MISOPROSTOL 200 MCG PO TABS
800.0000 ug | ORAL_TABLET | Freq: Once | ORAL | Status: AC
  Administered 2023-10-17: 800 ug via BUCCAL
  Filled 2023-10-17: qty 4

## 2023-10-17 MED ORDER — LIDOCAINE HCL (CARDIAC) PF 100 MG/5ML IV SOSY
PREFILLED_SYRINGE | INTRAVENOUS | Status: DC | PRN
  Administered 2023-10-17: 40 mg via INTRAVENOUS

## 2023-10-17 MED ORDER — IBUPROFEN 600 MG PO TABS: 600.0000 mg | ORAL_TABLET | Freq: Four times a day (QID) | ORAL | Status: DC

## 2023-10-17 MED ORDER — ONDANSETRON HCL 4 MG/2ML IJ SOLN
INTRAMUSCULAR | Status: AC
  Filled 2023-10-17: qty 2

## 2023-10-17 MED ORDER — MISOPROSTOL 200 MCG PO TABS
800.0000 ug | ORAL_TABLET | Freq: Once | ORAL | Status: AC
  Administered 2023-10-17: 800 ug via ORAL
  Filled 2023-10-17: qty 4

## 2023-10-17 MED ORDER — MIDAZOLAM HCL 2 MG/2ML IJ SOLN
INTRAMUSCULAR | Status: AC
  Filled 2023-10-17: qty 2

## 2023-10-17 MED ORDER — ONDANSETRON HCL 4 MG/2ML IJ SOLN: 4.0000 mg | Freq: Four times a day (QID) | INTRAMUSCULAR | Status: DC | PRN

## 2023-10-17 MED ORDER — BUPIVACAINE HCL 0.5 % IJ SOLN
INTRAMUSCULAR | Status: DC | PRN
  Administered 2023-10-17: 30 mL

## 2023-10-17 MED ORDER — METHYLERGONOVINE MALEATE 0.2 MG/ML IJ SOLN
INTRAMUSCULAR | Status: DC | PRN
  Administered 2023-10-17: .2 mg via INTRAMUSCULAR

## 2023-10-17 MED ORDER — DOXYCYCLINE HYCLATE 100 MG PO TABS
100.0000 mg | ORAL_TABLET | Freq: Two times a day (BID) | ORAL | Status: DC
  Administered 2023-10-17: 100 mg via ORAL
  Filled 2023-10-17: qty 1

## 2023-10-17 MED ORDER — BUPIVACAINE HCL (PF) 0.5 % IJ SOLN
INTRAMUSCULAR | Status: AC
  Filled 2023-10-17: qty 30

## 2023-10-17 MED ORDER — FENTANYL CITRATE (PF) 100 MCG/2ML IJ SOLN
INTRAMUSCULAR | Status: DC | PRN
  Administered 2023-10-17 (×2): 50 ug via INTRAVENOUS

## 2023-10-17 MED ORDER — MAGNESIUM CITRATE PO SOLN: 1.0000 | Freq: Once | ORAL | Status: DC | PRN

## 2023-10-17 MED ORDER — KETOROLAC TROMETHAMINE 30 MG/ML IJ SOLN: 30.0000 mg | Freq: Once | INTRAMUSCULAR | Status: DC | PRN

## 2023-10-17 MED ORDER — FENTANYL CITRATE (PF) 100 MCG/2ML IJ SOLN
INTRAMUSCULAR | Status: AC
  Filled 2023-10-17: qty 2

## 2023-10-17 MED ORDER — MENTHOL 3 MG MT LOZG: 1.0000 | LOZENGE | OROMUCOSAL | Status: DC | PRN

## 2023-10-17 MED ORDER — LIDOCAINE 2% (20 MG/ML) 5 ML SYRINGE
INTRAMUSCULAR | Status: AC
  Filled 2023-10-17: qty 5

## 2023-10-17 MED ORDER — ONDANSETRON HCL 4 MG PO TABS: 4.0000 mg | ORAL_TABLET | Freq: Four times a day (QID) | ORAL | Status: DC | PRN

## 2023-10-17 MED ORDER — DEXAMETHASONE SODIUM PHOSPHATE 10 MG/ML IJ SOLN
INTRAMUSCULAR | Status: DC | PRN
  Administered 2023-10-17: 10 mg via INTRAVENOUS

## 2023-10-17 MED ORDER — PANTOPRAZOLE SODIUM 40 MG PO TBEC
40.0000 mg | DELAYED_RELEASE_TABLET | Freq: Every day | ORAL | Status: DC
  Administered 2023-10-17: 40 mg via ORAL
  Filled 2023-10-17: qty 1

## 2023-10-17 MED ORDER — LACTATED RINGERS IV SOLN: INTRAVENOUS | Status: DC | PRN

## 2023-10-17 MED ORDER — SIMETHICONE 80 MG PO CHEW: 80.0000 mg | CHEWABLE_TABLET | Freq: Four times a day (QID) | ORAL | Status: DC | PRN

## 2023-10-17 MED ORDER — ROCURONIUM BROMIDE 100 MG/10ML IV SOLN: INTRAVENOUS | Status: DC | PRN

## 2023-10-17 MED ORDER — SUCCINYLCHOLINE CHLORIDE 200 MG/10ML IV SOSY
PREFILLED_SYRINGE | INTRAVENOUS | Status: AC
  Filled 2023-10-17: qty 10

## 2023-10-17 MED ORDER — SUCCINYLCHOLINE CHLORIDE 200 MG/10ML IV SOSY
PREFILLED_SYRINGE | INTRAVENOUS | Status: DC | PRN
  Administered 2023-10-17: 120 mg via INTRAVENOUS

## 2023-10-17 MED ORDER — ZOLPIDEM TARTRATE 5 MG PO TABS: 5.0000 mg | ORAL_TABLET | Freq: Every evening | ORAL | Status: DC | PRN

## 2023-10-17 MED ORDER — KETOROLAC TROMETHAMINE 30 MG/ML IJ SOLN: 30.0000 mg | Freq: Four times a day (QID) | INTRAMUSCULAR | Status: DC

## 2023-10-17 MED ORDER — PROPOFOL 10 MG/ML IV BOLUS
INTRAVENOUS | Status: DC | PRN
  Administered 2023-10-17: 50 mg via INTRAVENOUS
  Administered 2023-10-17: 100 mg via INTRAVENOUS
  Administered 2023-10-17: 50 mg via INTRAVENOUS

## 2023-10-17 MED ORDER — HYDROMORPHONE HCL 2 MG PO TABS: 2.0000 mg | ORAL_TABLET | ORAL | Status: DC | PRN

## 2023-10-17 MED ORDER — ACETAMINOPHEN 500 MG PO TABS
1000.0000 mg | ORAL_TABLET | Freq: Four times a day (QID) | ORAL | Status: DC
  Administered 2023-10-17 – 2023-10-18 (×3): 1000 mg via ORAL
  Filled 2023-10-17 (×3): qty 2

## 2023-10-17 MED ORDER — ROCURONIUM BROMIDE 100 MG/10ML IV SOLN
INTRAVENOUS | Status: DC | PRN
  Administered 2023-10-17: 5 mg via INTRAVENOUS

## 2023-10-17 MED ORDER — ROCURONIUM BROMIDE 10 MG/ML (PF) SYRINGE
PREFILLED_SYRINGE | INTRAVENOUS | Status: AC
Start: 2023-10-17 — End: ?
  Filled 2023-10-17: qty 10

## 2023-10-17 MED ORDER — SENNOSIDES-DOCUSATE SODIUM 8.6-50 MG PO TABS: 1.0000 | ORAL_TABLET | Freq: Every evening | ORAL | Status: DC | PRN

## 2023-10-17 MED ORDER — GABAPENTIN 100 MG PO CAPS
100.0000 mg | ORAL_CAPSULE | Freq: Two times a day (BID) | ORAL | Status: DC
  Administered 2023-10-17: 100 mg via ORAL
  Filled 2023-10-17: qty 1

## 2023-10-17 MED ORDER — DEXAMETHASONE SODIUM PHOSPHATE 10 MG/ML IJ SOLN
INTRAMUSCULAR | Status: AC
  Filled 2023-10-17: qty 1

## 2023-10-17 MED ORDER — FENTANYL CITRATE (PF) 100 MCG/2ML IJ SOLN: 25.0000 ug | INTRAMUSCULAR | Status: DC | PRN

## 2023-10-17 MED ORDER — BISACODYL 10 MG RE SUPP: 10.0000 mg | Freq: Every day | RECTAL | Status: DC | PRN

## 2023-10-17 MED ORDER — OXYCODONE HCL 5 MG PO TABS: 5.0000 mg | ORAL_TABLET | ORAL | Status: DC | PRN

## 2023-10-17 MED ORDER — ONDANSETRON HCL 4 MG/2ML IJ SOLN
INTRAMUSCULAR | Status: DC | PRN
  Administered 2023-10-17: 4 mg via INTRAVENOUS

## 2023-10-17 SURGICAL SUPPLY — 19 items
CATH ROBINSON RED A/P 16FR (CATHETERS) ×1 IMPLANT
CLOTH BEACON ORANGE TIMEOUT ST (SAFETY) ×1 IMPLANT
DECANTER SPIKE VIAL GLASS SM (MISCELLANEOUS) ×1 IMPLANT
GLOVE BIOGEL PI IND STRL 7.0 (GLOVE) ×3 IMPLANT
GLOVE ECLIPSE 7.0 STRL STRAW (GLOVE) ×1 IMPLANT
GOWN STRL REUS W/ TWL XL LVL3 (GOWN DISPOSABLE) ×2 IMPLANT
GOWN STRL REUS W/TWL XL LVL3 (GOWN DISPOSABLE) ×2
KIT BERKELEY 1ST TRIMESTER 3/8 (MISCELLANEOUS) ×1 IMPLANT
NS IRRIG 1000ML POUR BTL (IV SOLUTION) ×1 IMPLANT
PACK VAGINAL MINOR WOMEN LF (CUSTOM PROCEDURE TRAY) ×1 IMPLANT
PAD OB MATERNITY 4.3X12.25 (PERSONAL CARE ITEMS) ×1 IMPLANT
PAD PREP 24X48 CUFFED NSTRL (MISCELLANEOUS) ×1 IMPLANT
SET BERKELEY SUCTION TUBING (SUCTIONS) ×1 IMPLANT
TOWEL OR 17X24 6PK STRL BLUE (TOWEL DISPOSABLE) ×2 IMPLANT
VACURETTE 10 RIGID CVD (CANNULA) IMPLANT
VACURETTE 6 ASPIR F TIP BERK (CANNULA) IMPLANT
VACURETTE 7MM CVD STRL WRAP (CANNULA) IMPLANT
VACURETTE 8 RIGID CVD (CANNULA) IMPLANT
VACURETTE 9 RIGID CVD (CANNULA) IMPLANT

## 2023-10-17 NOTE — Progress Notes (Signed)
Patient ID: Morgan Todd, female   DOB: 02-16-1986, 37 y.o.   MRN: [id number] Pt got up to the bathroom about 0500 and passed a little clot and membrane  I looked at it and there is no placenta  Cytotec 800 micrograms was given at 0133, repeat ordered Her cramps have eased up and her bleeding is scant Uterus is not tender on exam  Will continue to be conservative with placenta management at this point  Lazaro Arms, MD 10/17/2023 7:33 AM

## 2023-10-17 NOTE — Anesthesia Preprocedure Evaluation (Signed)
Anesthesia Evaluation  Patient identified by MRN, date of birth, ID band Patient awake    Reviewed: Allergy & Precautions, NPO status , Patient's Chart, lab work & pertinent test results  Airway Mallampati: II  TM Distance: >3 FB Neck ROM: Full    Dental no notable dental hx.    Pulmonary asthma , former smoker   Pulmonary exam normal breath sounds clear to auscultation       Cardiovascular negative cardio ROS Normal cardiovascular exam Rhythm:Regular Rate:Normal     Neuro/Psych  PSYCHIATRIC DISORDERS Anxiety     negative neurological ROS     GI/Hepatic negative GI ROS, Neg liver ROS,,,  Endo/Other  negative endocrine ROS    Renal/GU negative Renal ROS  negative genitourinary   Musculoskeletal negative musculoskeletal ROS (+)    Abdominal   Peds  Hematology  (+) Blood dyscrasia, Sickle cell trait and anemia Lab Results      Component                Value               Date                      WBC                      7.2                 10/16/2023                HGB                      10.8 (L)            10/16/2023                HCT                      30.8 (L)            10/16/2023                MCV                      88.8                10/16/2023                PLT                      289                 10/16/2023              Anesthesia Other Findings IUFD at [redacted]w[redacted]d gestation now with retained placenta, bleeding minimal, not febrile, did not have an epidural for delivery  Reproductive/Obstetrics                             Anesthesia Physical Anesthesia Plan  ASA: 2  Anesthesia Plan: General   Post-op Pain Management:    Induction: Intravenous  PONV Risk Score and Plan: 3 and Midazolam, Dexamethasone and Ondansetron  Airway Management Planned: Oral ETT  Additional Equipment:   Intra-op Plan:   Post-operative Plan: Extubation in OR  Informed Consent: I have  reviewed the patients History and Physical, chart, labs and discussed the procedure  including the risks, benefits and alternatives for the proposed anesthesia with the patient or authorized representative who has indicated his/her understanding and acceptance.     Dental advisory given  Plan Discussed with: CRNA  Anesthesia Plan Comments:        Anesthesia Quick Evaluation

## 2023-10-17 NOTE — Anesthesia Procedure Notes (Cosign Needed Addendum)
Procedure Name: Intubation Date/Time: 10/17/2023 1:31 PM  Performed by: Elgie Congo, CRNAPre-anesthesia Checklist: Patient identified, Emergency Drugs available, Suction available and Patient being monitored Patient Re-evaluated:Patient Re-evaluated prior to induction Oxygen Delivery Method: Circle system utilized Preoxygenation: Pre-oxygenation with 100% oxygen Induction Type: IV induction and Rapid sequence Laryngoscope Size: Glidescope Grade View: Grade I Tube type: Oral Tube size: 7.0 mm Number of attempts: 1 Airway Equipment and Method: Rigid stylet Placement Confirmation: ETT inserted through vocal cords under direct vision, positive ETCO2 and breath sounds checked- equal and bilateral Secured at: 20 cm Tube secured with: Tape Dental Injury: Teeth and Oropharynx as per pre-operative assessment

## 2023-10-17 NOTE — Op Note (Signed)
Morgan Todd PROCEDURE DATE:  10/17/2023  PREOPERATIVE DIAGNOSIS: Retained placenta after vaginal delivery of demised fetus at [redacted] week gestation POSTOPERATIVE DIAGNOSIS: The same PROCEDURE:  Dilation and Evacuation under ultrasound guidance SURGEON:  Dr. Jaynie Collins  INDICATIONS: 37 y.o.  [id number] with retained placenta after vaginal delivery of demised fetus at [redacted] week gestation, requiring surgical management.  Risks of surgery were discussed with the patient including but not limited to: bleeding which may require transfusion; infection which may require antibiotics; injury to uterus or surrounding organs; need for additional procedures including laparotomy or laparoscopy; possibility of intrauterine scarring which may impair future fertility; and other postoperative/anesthesia complications. Written informed consent was obtained.  FINDINGS:   18 week sized placenta  Empty endometrial stripe noted on ultrasound at the end of the procedure.   ANESTHESIA:    General, paracervical block with 30 ml of 0.5% Marcaine ESTIMATED BLOOD LOSS:  50 ml. SPECIMENS:  Products of conception sent to pathology COMPLICATIONS:  None immediate.  PROCEDURE DETAILS:  The patient was then taken to the operating room where anesthesia was administered and was found to be adequate.  After an adequate timeout was performed, she was placed in the dorsal lithotomy position and examined; then prepped and draped in the sterile manner.   Her bladder was catheterized for an unmeasured amount of clear, yellow urine. A vaginal speculum was then placed in the patient's vagina and a single tooth tenaculum was applied to the anterior lip of the cervix.  A paracervical block using 30 ml of 0.5% Marcaine was administered. The cervix was noted to be dilated to about 1.5 cm, and a ring forceps was introduced into the lower uterine segment.  A large portion of the placenta was grasped and removed.  A 16 mm suction curette was then  gently advanced to the uterine fundus.  The suction device was then activated and curette slowly rotated to clear the uterus of products of conception.  A sharp curettage was then performed to confirm complete emptying of the uterus. There was an empty endometrial stripe noted on the ultrasound at the end of the curettage. Methergine 0.2 mg IM was administered to help prevent bleeding. There was minimal bleeding noted at the end of the procedure, and the tenaculum removed with good hemostasis noted.   All instruments were removed from the patient's vagina.  Sponge and instrument counts were correct times three.    The patient tolerated the procedure well and was taken to the recovery area awake, extubated and in stable condition.   Jaynie Collins, MD, FACOG Obstetrician & Gynecologist, [facility] for [employer], [facility]

## 2023-10-17 NOTE — Progress Notes (Signed)
    Faculty Practice OB/GYN Attending Note  Patient is comfortable, minimal bleeding and cramping AFVSS She is s/p two doses of Misoprostol 800 mcg, last one around 0800 Nontender uterus on exam Cervical exam revealed 1 cm dilation, thin cervix with placenta palpated in lower uterine segment. Done in presence of RN as a chaperone.  Patient still has retained placenta s/p delivery of nonviable fetus at 20.   Discussed further management, offered further Misoprostol vs Dilation and Evacuation Patient declines further Misoprostol, wants to wait one hour and if there is no passage of placental tissue, proceed with Dilation and Evacuation. Details of procedure discussed with patient.  Risks of surgery including bleeding, infection, injury to surrounding organs, need for additional procedures, possibility of intrauterine scarring, risk of retained products which may require further management and other postoperative/anesthesia complications were explained to patient.  Likelihood of success of complete evacuation of the uterus was discussed with the patient.  Written informed consent was obtained.  Patient has been NPO since this morning, was on clear liquids since admission,  she will remain NPO for procedure. Anesthesia and OR teams aware.  Preoperative prophylactic Doxycycline 200 mg  will be ordered on call to the OR.  To OR when ready.  Continue close observation for now.   Jaynie Collins, MD, FACOG Obstetrician & Gynecologist, [facility] for [employer], [facility]

## 2023-10-17 NOTE — Anesthesia Postprocedure Evaluation (Signed)
Anesthesia Post Note  Patient: Morgan Todd  Procedure(s) Performed: DILATATION AND EVACUATION     Patient location during evaluation: PACU Anesthesia Type: General Level of consciousness: awake and alert Pain management: pain level controlled Vital Signs Assessment: post-procedure vital signs reviewed and stable Respiratory status: spontaneous breathing, nonlabored ventilation, respiratory function stable and patient connected to nasal cannula oxygen Cardiovascular status: blood pressure returned to baseline and stable Postop Assessment: no apparent nausea or vomiting Anesthetic complications: no  No notable events documented.  Last Vitals:  Vitals:   10/17/23 1502 10/17/23 1503  BP:    Pulse: 69 70  Resp: 17 17  Temp:    SpO2:      Last Pain:  Vitals:   10/17/23 1512  TempSrc:   PainSc: 0-No pain   Pain Goal:                   Min Tunnell L Shandon Matson

## 2023-10-17 NOTE — Transfer of Care (Signed)
Immediate Anesthesia Transfer of Care Note  Patient: Morgan Todd  Procedure(s) Performed: DILATATION AND EVACUATION  Patient Location: PACU  Anesthesia Type:General  Level of Consciousness: awake, alert , and oriented  Airway & Oxygen Therapy: Patient Spontanous Breathing  Post-op Assessment: Report given to RN and Post -op Vital signs reviewed and stable  Post vital signs: Reviewed and stable  Last Vitals:  Vitals Value Taken Time  BP 131/65 10/17/23 1436  Temp 37.1 C 10/17/23 1436  Pulse 81 10/17/23 1443  Resp 20 10/17/23 1443  SpO2 97 % 10/17/23 1443  Vitals shown include unfiled device data.  Last Pain:  Vitals:   10/17/23 1438  TempSrc:   PainSc: 0-No pain         Complications: No notable events documented.

## 2023-10-18 ENCOUNTER — Encounter (HOSPITAL_COMMUNITY): Payer: Self-pay | Admitting: Obstetrics & Gynecology

## 2023-10-18 LAB — CBC
HCT: 28.4 % — ABNORMAL LOW (ref 36.0–46.0)
Hemoglobin: 10.2 g/dL — ABNORMAL LOW (ref 12.0–15.0)
MCH: 31.2 pg (ref 26.0–34.0)
MCHC: 35.9 g/dL (ref 30.0–36.0)
MCV: 86.9 fL (ref 80.0–100.0)
Platelets: 189 10*3/uL (ref 150–400)
RBC: 3.27 MIL/uL — ABNORMAL LOW (ref 3.87–5.11)
RDW: 12.2 % (ref 11.5–15.5)
WBC: 15.9 10*3/uL — ABNORMAL HIGH (ref 4.0–10.5)
nRBC: 0 % (ref 0.0–0.2)

## 2023-10-18 LAB — CMV IGM: CMV IgM: <30 [AU]/ml (ref 0.0–29.9)

## 2023-10-18 MED ORDER — ACETAMINOPHEN 500 MG PO TABS: 1000.0000 mg | ORAL_TABLET | Freq: Three times a day (TID) | ORAL | 0 refills | Status: AC | PRN

## 2023-10-18 MED ORDER — IBUPROFEN 600 MG PO TABS: 600.0000 mg | ORAL_TABLET | Freq: Four times a day (QID) | ORAL | 0 refills | Status: AC | PRN

## 2023-10-18 NOTE — Discharge Summary (Signed)
Postpartum Discharge Summary     Patient Name: CEIRA WILLOCKS DOB: 18-Jun-1986 MRN: [id number]  Date of admission: 10/16/2023 Delivery date: 10/17/23 Delivering provider: Dr. Despina Hidden Date of discharge: 10/18/2023  Admitting diagnosis: Missed abortion [O02.1] Intrauterine pregnancy: [redacted]w[redacted]d     Secondary diagnosis:  Principal Problem:   Intrauterine fetal demise at [redacted] weeks gestation Active Problems:   History of 2 cesarean sections   Retained placenta w/o hemorrhage, s/p D&E  Additional problems: asthma, anxiety   Discharge diagnosis:  IUFD delivered                                               Post partum procedures:curettage  Augmentation: Cytotec Complications: Stillborn, Retained [facility]  Hospital course: Patient had previously been diagnosed with IUFD on 10/15/23. She opted for medical management and presented for admission on 10/16/23. She delivered the pregnancy on 10/17/23. She had retained POCs so underwent uncomplicated D&E on 10/17/23. On day of discharge, Ms. Mormile was coping appropriately, tolerating regular diet, ambulating, and voiding spontaneously. She was discharged home on 10/18/23 in good condition.  Immunizations received: Immunization History  Administered Date(s) Administered   Influenza Split 12/29/2012   Influenza,inj,Quad PF,6+ Mos 01/06/2015, 02/20/2016   Moderna SARS-COV2 Booster Vaccination 10/25/2020   Moderna Sars-Covid-2 Vaccination 03/23/2020, 04/25/2020, 10/25/2020   PPD Test 01/09/2015   Tdap 04/29/2013    Physical exam  Vitals:   10/17/23 1542 10/17/23 1837 10/17/23 1946 10/17/23 2315  BP: 117/72 118/73 110/78 117/73  Pulse: 80 73 70 70  Resp: 14 16 17 18   Temp: 98.6 F (37 C) 98.4 F (36.9 C) 99.2 F (37.3 C) 97.9 F (36.6 C)  TempSrc: Oral Oral Oral Oral  SpO2: 97% 97% 98% 99%  Weight:      Height:       General: alert, cooperative, and no distress Lochia: appropriate Abdomen: soft, non tender DVT Evaluation: No evidence of  DVT seen on physical exam. Labs: Lab Results  Component Value Date   WBC 15.9 (H) 10/18/2023   HGB 10.2 (L) 10/18/2023   HCT 28.4 (L) 10/18/2023   MCV 86.9 10/18/2023   PLT 189 10/18/2023      Latest Ref Rng & Units 09/12/2021    8:33 AM  CMP  Glucose 70 - 99 mg/dL 92   BUN 6 - 20 mg/dL <5   Creatinine 4.13 - 1.00 mg/dL 2.44   Sodium 010 - 272 mmol/L 136   Potassium 3.5 - 5.1 mmol/L 3.1   Chloride 98 - 111 mmol/L 99   CO2 22 - 32 mmol/L 27   Calcium 8.9 - 10.3 mg/dL 8.7   Total Protein 6.5 - 8.1 g/dL 7.2   Total Bilirubin 0.3 - 1.2 mg/dL 0.9   Alkaline Phos 38 - 126 U/L 41   AST 15 - 41 U/L 28   ALT 0 - 44 U/L 18    Edinburgh Score:     No data to display         No data recorded  After visit meds:  Allergies as of 10/18/2023   No Known Allergies      Medication List     TAKE these medications    acetaminophen 500 MG tablet Commonly known as: TYLENOL Take 2 tablets (1,000 mg total) by mouth every 8 (eight) hours as needed.   albuterol 108 (90 Base) MCG/ACT  inhaler Commonly known as: VENTOLIN HFA Inhale 2 puffs into the lungs every 6 (six) hours as needed for wheezing or shortness of breath.   fluticasone 50 MCG/ACT nasal spray Commonly known as: FLONASE Place 2 sprays into both nostrils daily.   ibuprofen 600 MG tablet Commonly known as: ADVIL Take 1 tablet (600 mg total) by mouth every 6 (six) hours as needed.   Prenatal Vitamin 27-0.8 MG Tabs Take 1 tablet by mouth daily.         Discharge home in stable condition Infant Disposition:morgue Discharge instruction: per After Visit Summary and Postpartum booklet. Activity: Advance as tolerated. Pelvic rest for 6 weeks.  Diet: routine diet Future Appointments: Future Appointments  Date Time Provider Department Center  10/23/2023  8:15 AM [facility] NURSE [facility]  10/23/2023  8:30 AM [facility] US6 [facility]  10/27/2023 10:30 AM Darral Dash, DO [facility]  10/28/2023  9:15 AM  Hoffman Bing, MD [facility]   Follow up Visit:  Follow-up Information     [facility] Follow up in 4 week(s).   Specialty: Obstetrics and Gynecology Why: Our office will call you to schedule a follow up appointment in 2-4 weeks Contact information: [address] [place] [address] [phone]                 Please schedule this patient for a In person visit in 2-4 weeks with the following provider: Any provider but preferably Anyanwu, Keerthi Hazell or Eure Additional Postpartum F/U:Postpartum Depression checkup  Delivery mode: Intrauterine fetal demise delivered vaginally followed by D&E Anticipated Birth Control:  Unsure   10/18/2023 Lennart Pall, MD

## 2023-10-21 ENCOUNTER — Ambulatory Visit (INDEPENDENT_AMBULATORY_CARE_PROVIDER_SITE_OTHER): Payer: BC Managed Care – PPO | Admitting: Student

## 2023-10-21 VITALS — BP 100/60 | HR 99 | Temp 98.4°F | Ht 61.0 in | Wt 112.0 lb

## 2023-10-21 DIAGNOSIS — Z008 Encounter for other general examination: Secondary | ICD-10-CM | POA: Diagnosis not present

## 2023-10-21 DIAGNOSIS — O039 Complete or unspecified spontaneous abortion without complication: Secondary | ICD-10-CM

## 2023-10-21 LAB — SURGICAL PATHOLOGY

## 2023-10-21 MED ORDER — SERTRALINE HCL 25 MG PO TABS
25.0000 mg | ORAL_TABLET | Freq: Every day | ORAL | 0 refills | Status: DC
Start: 2023-10-21 — End: 2023-11-24

## 2023-10-21 NOTE — Patient Instructions (Signed)
It was great to see you today! Thank you for choosing [facility] for your primary care.  Today we addressed: I am so sorry for your loss however I am proud of you for coming in so soon and doing the best to take care of yourself.  Excellent job on being set up with a counselor, if you are ready I would consider making your partner a part of that counseling conversation as couples therapy can also be very beneficial since you went through this together.  We have agreed to restart a medication to help, this does not need to be a long-term medication but until you are in a better headspace, we can continue to adjust as needed.  I would recommend FMLA while you are going through this grief however recognize that eventually going back to work and being around colleagues may be beneficial.  Lets touch base in 2 weeks to see how you are doing.  I have also attached a grief group for you to look into.  They are meeting next online on November 13 in the evening.  [email or web address]   If you haven't already, sign up for My Chart to have easy access to your labs results, and communication with your primary care physician.  Please arrive 15 minutes before your appointment to ensure smooth check in process.  We appreciate your efforts in making this happen.  Thank you for allowing me to participate in your care, Shelby Mattocks, DO 10/21/2023, 11:59 AM PGY-3, [facility]

## 2023-10-21 NOTE — Progress Notes (Signed)
  SUBJECTIVE:   CHIEF COMPLAINT / HPI:   Recent miscarriage over the weekend, she states she is very emotional and having a very difficult time with continuous crying spells.  Her partner is also struggling and they are just now getting back to the point where they are able to speak with each other.  She does note that she has a counselor that she met with today and will be seeing every 2 weeks.  She struggled with depression in the past while going through divorce and was on Zoloft at that time.  She has 2 other kids at home and is able to live with them.  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  She is having difficulty with sleeping and eating at this time but is still drinking fluids.  Denies SI/HI because she still has a lot to live for and has her 2 children as well.  PERTINENT  PMH / PSH: Recent fetal demise  OBJECTIVE:  BP 100/60   Pulse 99   Temp 98.4 F (36.9 C)   Ht 5\' 1"  (1.549 m)   Wt 112 lb (50.8 kg)   LMP 06/10/2023   SpO2 99%   BMI 21.16 kg/m  General: NAD, tearful Psych: Low mood, normal affect, appropriately conversational  ASSESSMENT/PLAN:   Assessment & Plan Fetal demise due to miscarriage Unfortunate loss, she is struggling emotionally as expected.  Reassuringly, she has family at home and understandably is having emotional challenges with partner but they are attempting to work through.  She is already established with a Veterinary surgeon and has [employer] group.  Provided resources for grief counseling group.  Provided work note recommending FMLA.  Restarted Zoloft at 25 mg.  Follow-up in 2 weeks.   Return in about 2 weeks (around 11/04/2023) for Miscarriage follow-up. Shelby Mattocks, DO 10/21/2023, 3:33 PM PGY-3, [facility]

## 2023-10-23 ENCOUNTER — Ambulatory Visit: Payer: BC Managed Care – PPO

## 2023-10-23 DIAGNOSIS — O09522 Supervision of elderly multigravida, second trimester: Secondary | ICD-10-CM

## 2023-10-23 DIAGNOSIS — O34219 Maternal care for unspecified type scar from previous cesarean delivery: Secondary | ICD-10-CM

## 2023-10-24 ENCOUNTER — Encounter: Payer: Self-pay | Admitting: Student

## 2023-10-27 ENCOUNTER — Encounter: Payer: BC Managed Care – PPO | Admitting: Student

## 2023-10-27 ENCOUNTER — Encounter: Payer: Self-pay | Admitting: Student

## 2023-10-28 ENCOUNTER — Ambulatory Visit: Payer: BC Managed Care – PPO | Admitting: Obstetrics and Gynecology

## 2023-10-28 ENCOUNTER — Encounter: Payer: Self-pay | Admitting: Obstetrics and Gynecology

## 2023-10-29 NOTE — Progress Notes (Signed)
Patient did not keep her postpartum appointment for 10/28/2023.  Cornelia Copa MD Attending [facility] for [employer] [facility])

## 2023-11-06 NOTE — Progress Notes (Deleted)
  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  Last seen on 10/21/2023 restarted on Zoloft.  She was provided resources for grief counseling group.  She is already established with a counselor and [employer] group.  PERTINENT  PMH / PSH: Fetal demise  OBJECTIVE:  LMP 06/10/2023  Physical Exam   ASSESSMENT/PLAN:   Assessment & Plan  No follow-ups on file. Shelby Mattocks, DO 11/06/2023, 3:10 PM PGY-3, [facility] {    This will disappear when note is signed, click to select method of visit    :1}

## 2023-11-07 ENCOUNTER — Ambulatory Visit: Payer: BC Managed Care – PPO | Admitting: Student

## 2023-11-14 ENCOUNTER — Ambulatory Visit (INDEPENDENT_AMBULATORY_CARE_PROVIDER_SITE_OTHER): Payer: BC Managed Care – PPO | Admitting: Student

## 2023-11-14 ENCOUNTER — Encounter: Payer: Self-pay | Admitting: Student

## 2023-11-14 VITALS — BP 104/77 | HR 72

## 2023-11-14 DIAGNOSIS — F4321 Adjustment disorder with depressed mood: Secondary | ICD-10-CM | POA: Diagnosis not present

## 2023-11-14 DIAGNOSIS — N63 Unspecified lump in unspecified breast: Secondary | ICD-10-CM | POA: Diagnosis not present

## 2023-11-14 NOTE — Assessment & Plan Note (Addendum)
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  Stable on Zoloft 25 mg daily and regular counseling.  Continue current management, follow-up as needed.

## 2023-11-14 NOTE — Progress Notes (Signed)
  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  Last seen on 10/21/2023 restarted on Zoloft.  She was provided resources for grief counseling group.  She is already established with a counselor and [employer] group.  She states she is doing better and she is thankful that she has therapy through her insurance.  She has been able to return to work.  She would like to keep her Zoloft dosage at the current dosage.  Breast mass: Notes 2 days ago she could feel a palpable golf sized mass on her left breast.  She has never had a mass before.  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  Stable on Zoloft 25 mg daily and regular counseling.  Continue current management, follow-up as needed. Breast mass in female Considerable palpable left breast mass, no appreciable lymphadenopathy or nipple discharge. Return if symptoms worsen or fail to improve. Shelby Mattocks, DO 11/14/2023, 2:45 PM PGY-3, [facility]

## 2023-11-14 NOTE — Patient Instructions (Addendum)
It was great to see you today! Thank you for choosing [facility] for your primary care.  Today we addressed: I am glad you are doing better regarding the grief of miscarriage.  Please let me know if you need to discuss this further. Breast mass: We are going to get an ultrasound and mammogram.  Please call the breast center of [facility] imaging.  I have placed the orders for this imaging.  If you haven't already, sign up for My Chart to have easy access to your labs results, and communication with your primary care physician.  Return if symptoms worsen or fail to improve. Please arrive 15 minutes before your appointment to ensure smooth check in process.  We appreciate your efforts in making this happen.  Thank you for allowing me to participate in your care, Shelby Mattocks, DO 11/14/2023, 2:22 PM PGY-3, [facility]

## 2023-11-14 NOTE — Assessment & Plan Note (Signed)
Considerable palpable left breast mass, no appreciable lymphadenopathy or nipple discharge.

## 2023-11-20 ENCOUNTER — Other Ambulatory Visit: Payer: Self-pay | Admitting: Student

## 2023-11-20 DIAGNOSIS — O039 Complete or unspecified spontaneous abortion without complication: Secondary | ICD-10-CM

## 2023-11-20 DIAGNOSIS — F4321 Adjustment disorder with depressed mood: Secondary | ICD-10-CM

## 2023-12-04 ENCOUNTER — Ambulatory Visit
Admission: RE | Admit: 2023-12-04 | Discharge: 2023-12-04 | Disposition: A | Discharge status: Home / Self Care | Payer: BC Managed Care – PPO | Source: Ambulatory Visit | Attending: Family Medicine | Admitting: Family Medicine

## 2023-12-04 ENCOUNTER — Other Ambulatory Visit: Payer: Self-pay | Admitting: Student

## 2023-12-04 DIAGNOSIS — N63 Unspecified lump in unspecified breast: Secondary | ICD-10-CM

## 2023-12-04 DIAGNOSIS — N6002 Solitary cyst of left breast: Secondary | ICD-10-CM

## 2023-12-04 DIAGNOSIS — N6320 Unspecified lump in the left breast, unspecified quadrant: Secondary | ICD-10-CM

## 2024-03-04 ENCOUNTER — Inpatient Hospital Stay: Admission: RE | Admit: 2024-03-04 | Payer: BC Managed Care – PPO | Source: Ambulatory Visit

## 2024-03-16 ENCOUNTER — Ambulatory Visit: Admitting: Student

## 2024-03-18 ENCOUNTER — Other Ambulatory Visit (HOSPITAL_COMMUNITY)
Admission: RE | Admit: 2024-03-18 | Discharge: 2024-03-18 | Disposition: A | Discharge status: Home / Self Care | Source: Ambulatory Visit | Attending: Family Medicine | Admitting: Family Medicine

## 2024-03-18 ENCOUNTER — Ambulatory Visit: Admitting: Student

## 2024-03-18 ENCOUNTER — Encounter: Payer: Self-pay | Admitting: Student

## 2024-03-18 VITALS — BP 118/79 | HR 63 | Ht 60.0 in | Wt 116.0 lb

## 2024-03-18 DIAGNOSIS — N898 Other specified noninflammatory disorders of vagina: Secondary | ICD-10-CM

## 2024-03-18 DIAGNOSIS — Z113 Encounter for screening for infections with a predominantly sexual mode of transmission: Secondary | ICD-10-CM

## 2024-03-18 DIAGNOSIS — F4320 Adjustment disorder, unspecified: Secondary | ICD-10-CM

## 2024-03-18 DIAGNOSIS — F4321 Adjustment disorder with depressed mood: Secondary | ICD-10-CM

## 2024-03-18 DIAGNOSIS — O039 Complete or unspecified spontaneous abortion without complication: Secondary | ICD-10-CM

## 2024-03-18 LAB — POCT WET PREP (WET MOUNT)
Clue Cells Wet Prep Whiff POC: POSITIVE
Trichomonas Wet Prep HPF POC: ABSENT

## 2024-03-18 MED ORDER — METRONIDAZOLE 500 MG PO TABS: 500.0000 mg | ORAL_TABLET | Freq: Two times a day (BID) | ORAL | 0 refills | Status: AC

## 2024-03-18 MED ORDER — FLUCONAZOLE 150 MG PO TABS: 150.0000 mg | ORAL_TABLET | Freq: Once | ORAL | 0 refills | Status: AC

## 2024-03-18 MED ORDER — SERTRALINE HCL 50 MG PO TABS: 50.0000 mg | ORAL_TABLET | Freq: Every day | ORAL | 0 refills | Status: DC

## 2024-03-18 NOTE — Patient Instructions (Signed)
 It was great to see you today!   I will have your results back in 1-2 weeks. If anything comes back abnormal I will call you.   Please make an appointment to see your therapist.   I would like to follow back up with you in month to check in and make sure you are doing well.   No future appointments.  Please arrive 15 minutes before your appointment to ensure smooth check in process.    Please call the clinic at [phone] if your symptoms worsen or you have any concerns.  Thank you for allowing me to participate in your care, Dr. Glendale Chard [facility]

## 2024-03-18 NOTE — Progress Notes (Signed)
    SUBJECTIVE:   CHIEF COMPLAINT / HPI:   Morgan Todd is a 38 y.o. female  presenting for STI testing and depression follow-up.  Patient recently found out that her long-term partner had been cheating.  She is requesting a full panel of STI testing today.  She is currently asymptomatic.  Grief reaction: Recently lost her father.  She has been seeing a therapist but has not seen them since loss of her father.  She would like to increase her Zoloft to 50 mg.  PERTINENT  PMH / PSH: Reviewed and updated   OBJECTIVE:   BP 118/79   Pulse 63   Ht 5' (1.524 m)   Wt 116 lb (52.6 kg)   LMP 06/10/2023   SpO2 100%   BMI 22.65 kg/m   Well-appearing, no acute distress Cardio: Regular rate, regular rhythm, no murmurs on exam. Pulm: Clear, no wheezing, no crackles. No increased work of breathing Abdominal: bowel sounds present, soft, non-tender, non-distended Extremities: no peripheral edema   Pelvic Exam: MA chaperone present  Normal external genitalia No abnormal discharge       03/18/2024   11:20 AM 10/21/2023   12:05 PM 10/01/2023   10:53 AM  PHQ9 SCORE ONLY  PHQ-9 Total Score 3 11 1       ASSESSMENT/PLAN:   Grief reaction Increase Zoloft to 50 mg a day.  Patient to follow-up in 1 month.  Encourage patient to schedule appoint with therapist.   STI testing: GC, wet prep, HIV, RPR collected today. Wet prep positive for BV and yeast.  70 course of metronidazole sent to pharmacy with 1 dose of fluconazole to be taken after completing antibiotics.  Glendale Chard, DO [facility]

## 2024-03-18 NOTE — Assessment & Plan Note (Signed)
 Increase Zoloft to 50 mg a day.  Patient to follow-up in 1 month.  Encourage patient to schedule appoint with therapist.

## 2024-03-19 LAB — CERVICOVAGINAL ANCILLARY ONLY
Chlamydia: NEGATIVE
Comment: NEGATIVE
Comment: NORMAL
Neisseria Gonorrhea: NEGATIVE

## 2024-03-19 LAB — HIV ANTIBODY (ROUTINE TESTING W REFLEX): HIV Screen 4th Generation wRfx: NONREACTIVE

## 2024-03-19 LAB — RPR: RPR Ser Ql: NONREACTIVE

## 2024-04-30 ENCOUNTER — Ambulatory Visit (INDEPENDENT_AMBULATORY_CARE_PROVIDER_SITE_OTHER): Admitting: Student

## 2024-04-30 ENCOUNTER — Telehealth: Admitting: Physician Assistant

## 2024-04-30 ENCOUNTER — Other Ambulatory Visit (HOSPITAL_COMMUNITY)
Admission: RE | Admit: 2024-04-30 | Discharge: 2024-04-30 | Disposition: A | Discharge status: Home / Self Care | Source: Ambulatory Visit | Attending: Family Medicine | Admitting: Family Medicine

## 2024-04-30 VITALS — BP 127/85 | HR 82 | Ht 61.0 in | Wt 119.0 lb

## 2024-04-30 DIAGNOSIS — Z202 Contact with and (suspected) exposure to infections with a predominantly sexual mode of transmission: Secondary | ICD-10-CM | POA: Insufficient documentation

## 2024-04-30 DIAGNOSIS — Z2981 Encounter for HIV pre-exposure prophylaxis: Secondary | ICD-10-CM | POA: Insufficient documentation

## 2024-04-30 DIAGNOSIS — Z79899 Other long term (current) drug therapy: Secondary | ICD-10-CM

## 2024-04-30 DIAGNOSIS — Z113 Encounter for screening for infections with a predominantly sexual mode of transmission: Secondary | ICD-10-CM | POA: Insufficient documentation

## 2024-04-30 DIAGNOSIS — Z309 Encounter for contraceptive management, unspecified: Secondary | ICD-10-CM

## 2024-04-30 MED ORDER — EMTRICITABINE-TENOFOVIR DF 200-300 MG PO TABS: 1.0000 | ORAL_TABLET | Freq: Every day | ORAL | 0 refills | Status: AC

## 2024-04-30 MED ORDER — PENICILLIN G BENZATHINE 1200000 UNIT/2ML IM SUSY
2.4000 10*6.[IU] | PREFILLED_SYRINGE | Freq: Once | INTRAMUSCULAR | Status: AC
  Administered 2024-04-30: 2.4 10*6.[IU] via INTRAMUSCULAR

## 2024-04-30 MED ORDER — NORGESTIMATE-ETH ESTRADIOL 0.25-35 MG-MCG PO TABS: 1.0000 | ORAL_TABLET | Freq: Every day | ORAL | 11 refills | Status: AC

## 2024-04-30 NOTE — Assessment & Plan Note (Signed)
 Patient prefers birth control pills as she does desire to become pregnant in the near future and has taken Sprintec in the past which she would like to take again.  She has no history of blood clots, does not smoke, no history of migraines or hypertension. - Rx Sprintec - Advised to take pregnancy test in a few weeks/if she does not start her period

## 2024-04-30 NOTE — Progress Notes (Signed)
 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 Advised in-person evaluation for testing and for IM treatment.   She voiced understanding and agrees to be seen at local UC.  No Charge.

## 2024-04-30 NOTE — Progress Notes (Signed)
    SUBJECTIVE:   CHIEF COMPLAINT / HPI:   Patient's new and only sexual partner recently tested positive for syphilis.  She tested negative for HIV, syphilis, gonorrhea, chlamydia and trichomoniasis 03/18/2024 but states she has had intercourse with him since including oral sex. She denies any symptoms including fever, abdominal pain, nausea, increased discharge or odor or color to discharge She is very interested in starting PrEP therapy and birth control today.  She previously took Sprintec which she would like to start again.   Her periods occur monthly and LMP was 04/15/24  PERTINENT  PMH / PSH: None pertinent  OBJECTIVE:   BP 127/85   Pulse 82   Ht 5\' 1"  (1.549 m)   Wt 119 lb (54 kg)   LMP 04/16/2024   SpO2 95%   Breastfeeding No   BMI 22.48 kg/m    General: NAD, pleasant, able to participate in exam Cardiac: Well-perfused Respiratory: Normal effort GU: Chaperoned by CMA.  Normal external female genitalia, moist pink vaginal mucosa, normal-appearing cervix, moderate amount of clear/whitish discharge Skin: warm and dry Neuro: alert, no obvious focal deficits Psych: Normal affect and mood  ASSESSMENT/PLAN:   Exposure to syphilis No current symptoms but current sexual partner recently tested positive. - 1 dose penicillin G 2.4 M units IM - Will screen for all STDs including RPR, HIV, vaginal and throat swabs for gonorrhea and chlamydia and vaginal swab for trichomoniasis  Encounter for HIV pre-exposure prophylaxis Patient wants to start Truvada today. - Rx Truvada, wait for tests to return before starting  - test for STDs today including HIV - lipid panel and BMP today and repeat in 6 to 12 months - return for HIV test in 3 months  - Advised to use condoms to prevent STDs  Encounter for contraceptive management Patient prefers birth control pills as she does desire to become pregnant in the near future and has taken Sprintec in the past which she would like to take  again.  She has no history of blood clots, does not smoke, no history of migraines or hypertension. - Rx Sprintec - Advised to take pregnancy test in a few weeks/if she does not start her period     Dr. Glenn Lange, DO [facility]

## 2024-04-30 NOTE — Assessment & Plan Note (Addendum)
 Patient wants to start Truvada today. - Rx Truvada, wait for tests to return before starting  - test for STDs today including HIV - lipid panel and BMP today and repeat in 6 to 12 months - return for HIV test in 3 months  - Advised to use condoms to prevent STDs

## 2024-04-30 NOTE — Patient Instructions (Addendum)
 It was great to see you! Thank you for allowing me to participate in your care!  I recommend that you always bring your medications to each appointment as this makes it easy to ensure you are on the correct medications and helps us  not miss when refills are needed.  Our plans for today:  - Truvada sent to pharmacy as part of PrEP therapy, you can start taking this once all of your tests come back and I contact you -Return in 3 months to repeat tests -I still recommend condom use to prevent STDs - Birth control pill sent to pharmacy, recommend taking pregnancy test if your next period does not come to ensure you are not pregnant  We are checking some labs today, I will call you if they are abnormal will send you a MyChart message or a letter if they are normal.  If you do not hear about your labs in the next 2 weeks please let us  know.  Take care and seek immediate care sooner if you develop any concerns.   Dr. Glenn Lange, DO [facility]

## 2024-04-30 NOTE — Assessment & Plan Note (Addendum)
 No current symptoms but current sexual partner recently tested positive. - 1 dose penicillin G 2.4 M units IM - Will screen for all STDs including RPR, HIV, vaginal and throat swabs for gonorrhea and chlamydia and vaginal swab for trichomoniasis

## 2024-05-01 LAB — HIV ANTIBODY (ROUTINE TESTING W REFLEX): HIV Screen 4th Generation wRfx: NONREACTIVE

## 2024-05-01 LAB — LIPID PANEL
Chol/HDL Ratio: 3.2 ratio (ref 0.0–4.4)
Cholesterol, Total: 226 mg/dL — ABNORMAL HIGH (ref 100–199)
HDL: 71 mg/dL (ref >39)
LDL Chol Calc (NIH): 145 mg/dL — ABNORMAL HIGH (ref 0–99)
Triglycerides: 57 mg/dL (ref 0–149)
VLDL Cholesterol Cal: 10 mg/dL (ref 5–40)

## 2024-05-01 LAB — BASIC METABOLIC PANEL WITH GFR
BUN/Creatinine Ratio: 18 (ref 9–23)
BUN: 13 mg/dL (ref 6–20)
CO2: 24 mmol/L (ref 20–29)
Calcium: 9.6 mg/dL (ref 8.7–10.2)
Chloride: 101 mmol/L (ref 96–106)
Creatinine, Ser: 0.74 mg/dL (ref 0.57–1.00)
Glucose: 84 mg/dL (ref 70–99)
Potassium: 4.2 mmol/L (ref 3.5–5.2)
Sodium: 138 mmol/L (ref 134–144)
eGFR: 105 mL/min/{1.73_m2} (ref >59)

## 2024-05-01 LAB — RPR: RPR Ser Ql: NONREACTIVE

## 2024-05-03 ENCOUNTER — Encounter: Payer: Self-pay | Admitting: Student

## 2024-05-04 ENCOUNTER — Ambulatory Visit: Payer: Self-pay | Admitting: Student

## 2024-05-04 LAB — CERVICOVAGINAL ANCILLARY ONLY
Chlamydia: NEGATIVE
Comment: NEGATIVE
Comment: NORMAL
Neisseria Gonorrhea: NEGATIVE

## 2024-05-05 LAB — CERVICOVAGINAL ANCILLARY ONLY
Chlamydia: NEGATIVE
Comment: NEGATIVE
Comment: NEGATIVE
Comment: NORMAL
Neisseria Gonorrhea: NEGATIVE
Trichomonas: NEGATIVE

## 2024-05-11 ENCOUNTER — Ambulatory Visit: Admitting: Student

## 2024-06-14 ENCOUNTER — Other Ambulatory Visit: Payer: Self-pay | Admitting: Student

## 2024-06-14 DIAGNOSIS — O039 Complete or unspecified spontaneous abortion without complication: Secondary | ICD-10-CM

## 2024-06-14 DIAGNOSIS — F4320 Adjustment disorder, unspecified: Secondary | ICD-10-CM

## 2024-09-10 ENCOUNTER — Other Ambulatory Visit: Payer: Self-pay | Admitting: Student

## 2024-09-10 DIAGNOSIS — F4320 Adjustment disorder, unspecified: Secondary | ICD-10-CM

## 2024-09-10 DIAGNOSIS — O039 Complete or unspecified spontaneous abortion without complication: Secondary | ICD-10-CM
